# Patient Record
Sex: Female | Born: 1985 | Race: Black or African American | Hispanic: No | Marital: Married | Smoking: Never smoker
Health system: Southern US, Community
[De-identification: ages and names within clinical notes are randomized; demographics above are authoritative.]

## PROBLEM LIST (undated history)

## (undated) DIAGNOSIS — O139 Gestational [pregnancy-induced] hypertension without significant proteinuria, unspecified trimester: Secondary | ICD-10-CM

## (undated) HISTORY — PX: TONSILLECTOMY: SUR1361

---

## 2019-09-19 ENCOUNTER — Other Ambulatory Visit: Payer: Self-pay

## 2019-09-19 ENCOUNTER — Encounter (HOSPITAL_COMMUNITY): Payer: Self-pay

## 2019-09-19 ENCOUNTER — Ambulatory Visit (HOSPITAL_COMMUNITY)
Admission: EM | Admit: 2019-09-19 | Discharge: 2019-09-19 | Disposition: A | Discharge status: Home / Self Care | Payer: Self-pay | Attending: Family Medicine | Admitting: Family Medicine

## 2019-09-19 DIAGNOSIS — N3090 Cystitis, unspecified without hematuria: Secondary | ICD-10-CM | POA: Insufficient documentation

## 2019-09-19 LAB — POCT URINALYSIS DIP (DEVICE)
Bilirubin Urine: NEGATIVE
Glucose, UA: NEGATIVE mg/dL
Ketones, ur: NEGATIVE mg/dL
Nitrite: NEGATIVE
Protein, ur: 100 mg/dL — AB
Specific Gravity, Urine: 1.02 (ref 1.005–1.030)
Urobilinogen, UA: 0.2 mg/dL (ref 0.0–1.0)
pH: 8.5 — ABNORMAL HIGH (ref 5.0–8.0)

## 2019-09-19 MED ORDER — FLUCONAZOLE 150 MG PO TABS: ORAL_TABLET | ORAL | 0 refills | Status: DC

## 2019-09-19 MED ORDER — NITROFURANTOIN MONOHYD MACRO 100 MG PO CAPS: 100.0000 mg | ORAL_CAPSULE | Freq: Two times a day (BID) | ORAL | 0 refills | Status: DC

## 2019-09-19 MED ORDER — PHENAZOPYRIDINE HCL 200 MG PO TABS: 200.0000 mg | ORAL_TABLET | Freq: Three times a day (TID) | ORAL | 0 refills | Status: DC

## 2019-09-19 NOTE — Discharge Instructions (Addendum)
Push fluids Take the antibiotic 2 x a day Take the pyridium for pain ( caution orange urine) Fill and use diflucan if needed for yeast symptoms Return as needed

## 2019-09-19 NOTE — ED Provider Notes (Signed)
[facility]    CSN: [id number] Arrival date & time: 09/19/19  1129      History   Chief Complaint Chief Complaint  Patient presents with  . Appointment  . (11;30 UTI)    HPI Pamela Castro is a 34 y.o. female.   HPI   Patient is here for urinary tract infection.  She states she has had bladder infections before, every year or 2 since she had children.  She has burning, frequency, and traces of blood in her urine.  This is been going on for 2 days.  No flank pain, abdominal pain.  No nausea or vomiting.  No fever or chills.  No history of kidney stones or kidney infections.  Patient is not pregnant She states that her prior urinary tract infection she was given Keflex.  She was then called a couple days later and switched to a sulfa antibiotic.  History reviewed. No pertinent past medical history.  There are no problems to display for this patient.   Past Surgical History:  Procedure Laterality Date  . TONSILLECTOMY      OB History   No obstetric history on file.      Home Medications    Prior to Admission medications   Medication Sig Start Date End Date Taking? Authorizing Provider  fluconazole (DIFLUCAN) 150 MG tablet Take at first sign of yeast infection.  Repeat in 72 hours if needed 09/19/19   Eustace Moore, MD  nitrofurantoin, macrocrystal-monohydrate, (MACROBID) 100 MG capsule Take 1 capsule (100 mg total) by mouth 2 (two) times daily. 09/19/19   Eustace Moore, MD  phenazopyridine (PYRIDIUM) 200 MG tablet Take 1 tablet (200 mg total) by mouth 3 (three) times daily. 09/19/19   Eustace Moore, MD    Family History Family History  Family history unknown: Yes    Social History Social History   Tobacco Use  . Smoking status: Never Smoker  Substance Use Topics  . Alcohol use: Not on file  . Drug use: Not on file     Allergies   Patient has no known allergies.   Review of Systems Review of Systems  Constitutional:  Negative for chills and fever.  Gastrointestinal: Negative for nausea and vomiting.  Genitourinary: Positive for dysuria, frequency and hematuria. Negative for flank pain, menstrual problem, pelvic pain and vaginal discharge.     Physical Exam Triage Vital Signs ED Triage Vitals  Enc Vitals Group     BP 09/19/19 1152 134/68     Pulse Rate 09/19/19 1152 100     Resp 09/19/19 1152 18     Temp 09/19/19 1152 98.4 F (36.9 C)     Temp Source 09/19/19 1152 Oral     SpO2 09/19/19 1152 99 %     Weight --      Height --      Head Circumference --      Peak Flow --      Pain Score 09/19/19 1155 5     Pain Loc --      Pain Edu? --      Excl. in GC? --    No data found.  Updated Vital Signs BP 134/68 (BP Location: Right Arm)   Pulse 100   Temp 98.4 F (36.9 C) (Oral)   Resp 18   LMP 09/07/2019   SpO2 99%     Physical Exam Constitutional:      General: She is not in acute distress.    Appearance:  She is well-developed and normal weight.     Comments: Exam by observation  HENT:     Head: Normocephalic and atraumatic.     Mouth/Throat:     Comments: Mask in place Eyes:     Conjunctiva/sclera: Conjunctivae normal.     Pupils: Pupils are equal, round, and reactive to light.  Cardiovascular:     Rate and Rhythm: Normal rate and regular rhythm.  Pulmonary:     Effort: Pulmonary effort is normal. No respiratory distress.  Musculoskeletal:        General: Normal range of motion.     Cervical back: Normal range of motion.  Skin:    General: Skin is warm and dry.  Neurological:     Mental Status: She is alert.  Psychiatric:        Mood and Affect: Mood normal.        Behavior: Behavior normal.      UC Treatments / Results  Labs (all labs ordered are listed, but only abnormal results are displayed) Labs Reviewed  POCT URINALYSIS DIP (DEVICE) - Abnormal; Notable for the following components:      Result Value   Hgb urine dipstick LARGE (*)    pH 8.5 (*)    Protein,  ur 100 (*)    Leukocytes,Ua MODERATE (*)    All other components within normal limits  URINE CULTURE    EKG   Radiology No results found.  Procedures Procedures (including critical care time)  Medications Ordered in UC Medications - No data to display  Initial Impression / Assessment and Plan / UC Course  I have reviewed the triage vital signs and the nursing notes.  Pertinent labs & imaging results that were available during my care of the patient were reviewed by me and considered in my medical decision making (see chart for details).      Final Clinical Impressions(s) / UC Diagnoses   Final diagnoses:  Cystitis     Discharge Instructions     Push fluids Take the antibiotic 2 x a day Take the pyridium for pain ( caution orange urine) Fill and use diflucan if needed for yeast symptoms Return as needed    ED Prescriptions    Medication Sig Dispense Auth. Provider   nitrofurantoin, macrocrystal-monohydrate, (MACROBID) 100 MG capsule Take 1 capsule (100 mg total) by mouth 2 (two) times daily. 14 capsule Raylene Everts, MD   phenazopyridine (PYRIDIUM) 200 MG tablet Take 1 tablet (200 mg total) by mouth 3 (three) times daily. 6 tablet Raylene Everts, MD   fluconazole (DIFLUCAN) 150 MG tablet Take at first sign of yeast infection.  Repeat in 72 hours if needed 2 tablet Meda Coffee Jennette Banker, MD     PDMP not reviewed this encounter.   Raylene Everts, MD 09/19/19 [phone]

## 2019-09-19 NOTE — ED Triage Notes (Signed)
Pt presents with urinary tract issues; pt states she has burning with urination, urinary, and blood in urine X 2 days.

## 2019-09-22 LAB — URINE CULTURE: Culture: >=100000 — AB

## 2020-05-23 ENCOUNTER — Other Ambulatory Visit: Payer: Self-pay

## 2020-05-23 ENCOUNTER — Encounter (HOSPITAL_COMMUNITY): Payer: Self-pay

## 2020-05-23 ENCOUNTER — Ambulatory Visit (HOSPITAL_COMMUNITY)
Admission: EM | Admit: 2020-05-23 | Discharge: 2020-05-23 | Disposition: A | Discharge status: Home / Self Care | Payer: BC Managed Care – PPO | Attending: Family Medicine | Admitting: Family Medicine

## 2020-05-23 DIAGNOSIS — Z3202 Encounter for pregnancy test, result negative: Secondary | ICD-10-CM | POA: Insufficient documentation

## 2020-05-23 DIAGNOSIS — J069 Acute upper respiratory infection, unspecified: Secondary | ICD-10-CM

## 2020-05-23 DIAGNOSIS — U071 COVID-19: Secondary | ICD-10-CM | POA: Insufficient documentation

## 2020-05-23 HISTORY — DX: Gestational (pregnancy-induced) hypertension without significant proteinuria, unspecified trimester: O13.9

## 2020-05-23 LAB — RESP PANEL BY RT PCR (RSV, FLU A&B, COVID)
Influenza A by PCR: NEGATIVE
Influenza B by PCR: NEGATIVE
Respiratory Syncytial Virus by PCR: NEGATIVE
SARS Coronavirus 2 by RT PCR: POSITIVE — AB

## 2020-05-23 LAB — POC URINE PREG, ED: Preg Test, Ur: NEGATIVE

## 2020-05-23 MED ORDER — PROMETHAZINE-DM 6.25-15 MG/5ML PO SYRP: 5.0000 mL | ORAL_SOLUTION | Freq: Four times a day (QID) | ORAL | 0 refills | Status: DC | PRN

## 2020-05-23 MED ORDER — PREDNISONE 20 MG PO TABS: 40.0000 mg | ORAL_TABLET | Freq: Every day | ORAL | 0 refills | Status: DC

## 2020-05-23 MED ORDER — ALBUTEROL SULFATE HFA 108 (90 BASE) MCG/ACT IN AERS: 1.0000 | INHALATION_SPRAY | Freq: Four times a day (QID) | RESPIRATORY_TRACT | 0 refills | Status: DC | PRN

## 2020-05-23 MED ORDER — FLUTICASONE PROPIONATE 50 MCG/ACT NA SUSP: 1.0000 | Freq: Two times a day (BID) | NASAL | 0 refills | Status: DC

## 2020-05-23 NOTE — ED Provider Notes (Signed)
[facility]    CSN: [id number] Arrival date & time: 05/23/20  1116      History   Chief Complaint Chief Complaint  Patient presents with  . Fever    HPI Pamela Castro is a 34 y.o. female.   Presenting today with 8 day hx of hacking cough, low grade fever initially, body aches, diarrhea, fatigue, loss of taste and smell, and now N/V intermittently. Denies CP, SOB, abdominal pain, rashes. Taking dayquil, nyquil, and OTC supplements without much benefit. Kids also sick with similar sxs. Not UTD on COVID vaccines. No known chronic pulmonary issues.  Also requesting preg test as she missed a few birth control pills and her period this past month (05/06/20 LMP) was not a typical menstrual cycle for her).     Past Medical History:  Diagnosis Date  . Gestational hypertension     There are no problems to display for this patient.   Past Surgical History:  Procedure Laterality Date  . TONSILLECTOMY      OB History   No obstetric history on file.      Home Medications    Prior to Admission medications   Medication Sig Start Date End Date Taking? Authorizing Provider  nitrofurantoin, macrocrystal-monohydrate, (MACROBID) 100 MG capsule Take 1 capsule (100 mg total) by mouth 2 (two) times daily. 09/19/19  Yes Eustace Moore, MD  Norethindrone Acetate-Ethinyl Estradiol (JUNEL 1.5/30) 1.5-30 MG-MCG tablet Junel FE 1.5/30 (28) 1.5 mg-30 mcg (21)/75 mg (7) tablet  Take 1 tablet every day by oral route for 84 days.   Yes [provider]  phenazopyridine (PYRIDIUM) 200 MG tablet Take 1 tablet (200 mg total) by mouth 3 (three) times daily. 09/19/19  Yes Eustace Moore, MD  albuterol (VENTOLIN HFA) 108 (90 Base) MCG/ACT inhaler Inhale 1-2 puffs into the lungs every 6 (six) hours as needed for wheezing or shortness of breath. 05/23/20   Particia Nearing, PA-C  fluconazole (DIFLUCAN) 150 MG tablet Take at first sign of yeast infection.  Repeat in 72  hours if needed 09/19/19   Eustace Moore, MD  fluticasone [facility]) 50 MCG/ACT nasal spray Place 1 spray into both nostrils 2 (two) times daily. 05/23/20   Particia Nearing, PA-C  predniSONE (DELTASONE) 20 MG tablet Take 2 tablets (40 mg total) by mouth daily with breakfast. 05/23/20   Particia Nearing, PA-C  promethazine-dextromethorphan (PROMETHAZINE-DM) 6.25-15 MG/5ML syrup Take 5 mLs by mouth 4 (four) times daily as needed for cough. 05/23/20   Particia Nearing, PA-C    Family History Family History  Family history unknown: Yes    Social History Social History   Tobacco Use  . Smoking status: Never Smoker  . Smokeless tobacco: Never Used  Vaping Use  . Vaping Use: Never used  Substance Use Topics  . Alcohol use: Yes  . Drug use: Never     Allergies   Patient has no known allergies.   Review of Systems Review of Systems PER HPI    Physical Exam Triage Vital Signs ED Triage Vitals  Enc Vitals Group     BP 05/23/20 1235 (!) 155/104     Pulse Rate 05/23/20 1235 100     Resp 05/23/20 1235 18     Temp 05/23/20 1235 98.2 F (36.8 C)     Temp Source 05/23/20 1235 Oral     SpO2 05/23/20 1235 98 %     Weight --      Height --  Head Circumference --      Peak Flow --      Pain Score 05/23/20 1229 0     Pain Loc --      Pain Edu? --      Excl. in GC? --    No data found.  Updated Vital Signs BP (!) 155/104 (BP Location: Right Arm)   Pulse 100   Temp 98.2 F (36.8 C) (Oral)   Resp 18   LMP 05/06/2020 (Approximate)   SpO2 98%   Visual Acuity Right Eye Distance:   Left Eye Distance:   Bilateral Distance:    Right Eye Near:   Left Eye Near:    Bilateral Near:     Physical Exam Vitals and nursing note reviewed.  Constitutional:      Appearance: Normal appearance. She is not ill-appearing.  HENT:     Head: Atraumatic.     Ears:     Comments: Mild middle ear effusion b/l    Nose: Rhinorrhea present.     Mouth/Throat:      Mouth: Mucous membranes are moist.     Pharynx: Posterior oropharyngeal erythema present.  Eyes:     Extraocular Movements: Extraocular movements intact.     Conjunctiva/sclera: Conjunctivae normal.  Cardiovascular:     Rate and Rhythm: Normal rate and regular rhythm.     Heart sounds: Normal heart sounds.  Pulmonary:     Effort: Pulmonary effort is normal.     Breath sounds: Normal breath sounds. No wheezing or rales.  Abdominal:     General: Bowel sounds are normal. There is no distension.     Palpations: Abdomen is soft.     Tenderness: There is no abdominal tenderness. There is no guarding.  Musculoskeletal:        General: Normal range of motion.     Cervical back: Normal range of motion and neck supple.  Skin:    General: Skin is warm and dry.  Neurological:     Mental Status: She is alert and oriented to person, place, and time.  Psychiatric:        Mood and Affect: Mood normal.        Thought Content: Thought content normal.        Judgment: Judgment normal.      UC Treatments / Results  Labs (all labs ordered are listed, but only abnormal results are displayed) Labs Reviewed  RESP PANEL BY RT PCR (RSV, FLU A&B, COVID)  POC URINE PREG, ED    EKG   Radiology No results found.  Procedures Procedures (including critical care time)  Medications Ordered in UC Medications - No data to display  Initial Impression / Assessment and Plan / UC Course  I have reviewed the triage vital signs and the nursing notes.  Pertinent labs & imaging results that were available during my care of the patient were reviewed by me and considered in my medical decision making (see chart for details).     Urine preg neg, resp panel pending. Vital signs today reassuring as was physical exam. Discussed albuterol inhaler, prednisone burst, phenergan DM and flonase to help with her viral bronchitis and supportive medications and home care. Return precautions and isolation protocol  reviewed at length. Work note given.   Final Clinical Impressions(s) / UC Diagnoses   Final diagnoses:  Negative pregnancy test  Viral URI with cough   Discharge Instructions   None    ED Prescriptions    Medication Sig Dispense  Auth. Provider   predniSONE (DELTASONE) 20 MG tablet Take 2 tablets (40 mg total) by mouth daily with breakfast. 10 tablet Particia Nearing, PA-C   promethazine-dextromethorphan (PROMETHAZINE-DM) 6.25-15 MG/5ML syrup Take 5 mLs by mouth 4 (four) times daily as needed for cough. 100 mL Particia Nearing, PA-C   albuterol (VENTOLIN HFA) 108 (90 Base) MCG/ACT inhaler Inhale 1-2 puffs into the lungs every 6 (six) hours as needed for wheezing or shortness of breath. 18 g Particia Nearing, PA-C   fluticasone [facility]) 50 MCG/ACT nasal spray Place 1 spray into both nostrils 2 (two) times daily. 16 g Particia Nearing, [place]     PDMP not reviewed this encounter.   Particia Nearing, [place] 05/23/20 1507

## 2020-05-23 NOTE — ED Triage Notes (Signed)
Pt states she awoke on 10/29 coughing and "choking like I swallowed a spider", low-grade fever the following day with Tmax 100.6. Monday 11/1 awoke with body aches, diarrhea; Tuesday worsening cough with "chest congestion", fatigue. Thursday loss of taste/smell.  Yesterday onset of n/v.  Denies SOB at present, ear pain, sore throat. Able to speak full sentences w/o difficulty.  Last took ibuprofen yesterday. Was taking dayquil, nyquil and OTC herbs.   Has not taken COVID vaccines. Pt also states she has missed taking her BCPs correctly and requests a preg test.

## 2020-05-24 ENCOUNTER — Other Ambulatory Visit: Payer: Self-pay | Admitting: Nurse Practitioner

## 2020-05-24 ENCOUNTER — Encounter: Payer: Self-pay | Admitting: Nurse Practitioner

## 2020-05-24 ENCOUNTER — Ambulatory Visit (HOSPITAL_COMMUNITY)
Admission: RE | Admit: 2020-05-24 | Discharge: 2020-05-24 | Disposition: A | Discharge status: Home / Self Care | Payer: BC Managed Care – PPO | Source: Ambulatory Visit | Attending: Pulmonary Disease | Admitting: Pulmonary Disease

## 2020-05-24 DIAGNOSIS — U071 COVID-19: Secondary | ICD-10-CM | POA: Diagnosis not present

## 2020-05-24 MED ORDER — DIPHENHYDRAMINE HCL 50 MG/ML IJ SOLN: 50.0000 mg | Freq: Once | INTRAMUSCULAR | Status: DC | PRN

## 2020-05-24 MED ORDER — METHYLPREDNISOLONE SODIUM SUCC 125 MG IJ SOLR: 125.0000 mg | Freq: Once | INTRAMUSCULAR | Status: DC | PRN

## 2020-05-24 MED ORDER — ALBUTEROL SULFATE HFA 108 (90 BASE) MCG/ACT IN AERS: 2.0000 | INHALATION_SPRAY | Freq: Once | RESPIRATORY_TRACT | Status: DC | PRN

## 2020-05-24 MED ORDER — FAMOTIDINE IN NACL 20-0.9 MG/50ML-% IV SOLN: 20.0000 mg | Freq: Once | INTRAVENOUS | Status: DC | PRN

## 2020-05-24 MED ORDER — SOTROVIMAB 500 MG/8ML IV SOLN
500.0000 mg | Freq: Once | INTRAVENOUS | Status: AC
  Administered 2020-05-24: 500 mg via INTRAVENOUS

## 2020-05-24 MED ORDER — EPINEPHRINE 0.3 MG/0.3ML IJ SOAJ: 0.3000 mg | Freq: Once | INTRAMUSCULAR | Status: DC | PRN

## 2020-05-24 MED ORDER — SODIUM CHLORIDE 0.9 % IV SOLN: INTRAVENOUS | Status: DC | PRN

## 2020-05-24 NOTE — Progress Notes (Signed)
I connected by phone with Pamela Castro on 05/24/2020 at 9:16 AM to discuss the potential use of a treatment for mild to moderate COVID-19 viral infection in non-hospitalized patients.  This patient is a 34 y.o. female that meets the FDA criteria for Emergency Use Authorization of bamlanivimab/etesevimab, casirivimab\imdevimab, or sotrovimab  Has a (+) direct SARS-CoV-2 viral test result  Has mild or moderate COVID-19   Is ? 34 years of age and weighs ? 40 kg  Is NOT hospitalized due to COVID-19  Is NOT requiring oxygen therapy or requiring an increase in baseline oxygen flow rate due to COVID-19  Is within 10 days of symptom onset  Has at least one of the high risk factor(s) for progression to severe COVID-19 and/or hospitalization as defined in EUA.  Specific high risk criteria : Other high risk medical condition per CDC:  SVI  I have spoken and communicated the following to the patient or parent/caregiver:  1. FDA has authorized the emergency use of bamlanivimab/etesevimab, casirivimab\imdevimab, or sotrovimab for the treatment of mild to moderate COVID-19 in adults and pediatric patients with positive results of direct SARS-CoV-2 viral testing who are 92 years of age and older weighing at least 40 kg, and who are at high risk for progressing to severe COVID-19 and/or hospitalization.  2. The significant known and potential risks and benefits of bamlanivimab/etesevimab, casirivimab\imdevimab, or sotrovimab, and the extent to which such potential risks and benefits are unknown.  3. Information on available alternative treatments and the risks and benefits of those alternatives, including clinical trials.  4. Patients treated with bamlanivimab/etesevimab, casirivimab\imdevimab, or sotrovimab should continue to self-isolate and use infection control measures (e.g., wear mask, isolate, social distance, avoid sharing personal items, clean and disinfect "high touch" surfaces, and  frequent handwashing) according to CDC guidelines.   5. The patient or parent/caregiver has the option to accept or refuse bamlanivimab/etesevimab, casirivimab\imdevimab, or sotrovimab.  After reviewing this information with the patient, the patient has agreed to receive one of the available covid 19 monoclonal antibodies and will be provided an appropriate fact sheet prior to infusion.  Consuello Masse, DNP, AGNP-C [phone] (Infusion Center Hotline)

## 2020-05-24 NOTE — Discharge Instructions (Signed)

## 2020-05-24 NOTE — Progress Notes (Signed)
Diagnosis: COVID-19  Physician: Dr. Wright  Procedure: Covid Infusion Clinic Med: Sotrovimab-Provided patient with Sotrovimab fact sheet for patients, parents and caregivers prior to infusion.  Complications: No immediate complications noted.  Discharge: Discharged home   Pamela Castro 05/24/2020   

## 2020-06-01 ENCOUNTER — Other Ambulatory Visit (INDEPENDENT_AMBULATORY_CARE_PROVIDER_SITE_OTHER): Payer: Self-pay

## 2020-06-02 ENCOUNTER — Encounter (HOSPITAL_COMMUNITY): Payer: Self-pay | Admitting: Emergency Medicine

## 2020-06-02 ENCOUNTER — Ambulatory Visit (INDEPENDENT_AMBULATORY_CARE_PROVIDER_SITE_OTHER): Payer: BC Managed Care – PPO

## 2020-06-02 ENCOUNTER — Ambulatory Visit (HOSPITAL_COMMUNITY)
Admission: EM | Admit: 2020-06-02 | Discharge: 2020-06-02 | Disposition: A | Discharge status: Home / Self Care | Payer: BC Managed Care – PPO | Attending: Emergency Medicine | Admitting: Emergency Medicine

## 2020-06-02 ENCOUNTER — Other Ambulatory Visit: Payer: Self-pay

## 2020-06-02 DIAGNOSIS — U071 COVID-19: Secondary | ICD-10-CM | POA: Diagnosis not present

## 2020-06-02 DIAGNOSIS — R059 Cough, unspecified: Secondary | ICD-10-CM

## 2020-06-02 DIAGNOSIS — R058 Other specified cough: Secondary | ICD-10-CM | POA: Diagnosis not present

## 2020-06-02 MED ORDER — AEROCHAMBER PLUS MISC: 2 refills | Status: DC

## 2020-06-02 MED ORDER — HYDROCOD POLST-CPM POLST ER 10-8 MG/5ML PO SUER: 5.0000 mL | Freq: Two times a day (BID) | ORAL | 0 refills | Status: DC | PRN

## 2020-06-02 MED ORDER — ALBUTEROL SULFATE HFA 108 (90 BASE) MCG/ACT IN AERS: 1.0000 | INHALATION_SPRAY | RESPIRATORY_TRACT | 0 refills | Status: DC | PRN

## 2020-06-02 MED ORDER — BENZONATATE 200 MG PO CAPS: 200.0000 mg | ORAL_CAPSULE | Freq: Three times a day (TID) | ORAL | 0 refills | Status: DC | PRN

## 2020-06-02 NOTE — ED Provider Notes (Signed)
HPI  SUBJECTIVE:  Pamela Castro is a 34 y.o. female who presents with 5 days of nasal congestion, yellowish rhinorrhea, cough productive of the same material as her nasal congestion.  She reports a continuous tickle in the back of her throat.  She reports sinus pain and pressure which has now resolved.  She reports wheezing and intermittent achy sharp abdominal soreness secondary to the cough.  She was diagnosed with Covid on 11/7 and is status post monoclonal antibody infusion.  She states that she was doing well last week when the cough reappeared.  She is concerned she may have Covid again.  She denies sore throat, chest pain.  No calf pain or swelling, hemoptysis, recent immobilization, pleuritic chest pain.  She has tried Vicks, promethazine cough syrup, albuterol and prednisone.  She states the promethazine helps.  Symptoms are worse with talking and seem to be aggravated with albuterol.  She does not have a spacer for her inhaler.  She denies GERD symptoms.  She has a past medical history of gestational hypertension and has had labile blood pressures for some time.  She is not on any medication for this.  No history of diabetes, PE, DVT.  She has a past medical history of GERD and pregnancy.  No asthma, emphysema, COPD, smoking.  She is on OCPs.Marland Kitchen  LMP: 10/21.  Denies possibility being pregnant.  PMD: None.    Past Medical History:  Diagnosis Date  . Gestational hypertension     Past Surgical History:  Procedure Laterality Date  . TONSILLECTOMY      Family History  Family history unknown: Yes    Social History   Tobacco Use  . Smoking status: Never Smoker  . Smokeless tobacco: Never Used  Vaping Use  . Vaping Use: Never used  Substance Use Topics  . Alcohol use: Yes  . Drug use: Never    No current facility-administered medications for this encounter.  Current Outpatient Medications:  .  albuterol (VENTOLIN HFA) 108 (90 Base) MCG/ACT inhaler, Inhale 1-2 puffs into  the lungs every 6 (six) hours as needed for wheezing or shortness of breath., Disp: 18 g, Rfl: 0 .  albuterol (VENTOLIN HFA) 108 (90 Base) MCG/ACT inhaler, Inhale 1-2 puffs into the lungs every 4 (four) hours as needed for wheezing or shortness of breath., Disp: 1 each, Rfl: 0 .  benzonatate (TESSALON) 200 MG capsule, Take 1 capsule (200 mg total) by mouth 3 (three) times daily as needed for cough., Disp: 30 capsule, Rfl: 0 .  chlorpheniramine-HYDROcodone (TUSSIONEX PENNKINETIC ER) 10-8 MG/5ML SUER, Take 5 mLs by mouth every 12 (twelve) hours as needed for cough., Disp: 60 mL, Rfl: 0 .  fluticasone (FLONASE) 50 MCG/ACT nasal spray, Place 1 spray into both nostrils 2 (two) times daily., Disp: 16 g, Rfl: 0 .  Norethindrone Acetate-Ethinyl Estradiol (JUNEL 1.5/30) 1.5-30 MG-MCG tablet, Junel FE 1.5/30 (28) 1.5 mg-30 mcg (21)/75 mg (7) tablet  Take 1 tablet every day by oral route for 84 days., Disp: , Rfl:  .  Spacer/Aero-Holding Chambers (AEROCHAMBER PLUS) inhaler, Use with inhaler, Disp: 1 each, Rfl: 2  No Known Allergies   ROS  As noted in HPI.   Physical Exam  BP (!) 148/120 (BP Location: Right Arm)   Pulse 99   Temp 98.3 F (36.8 C) (Oral)   Resp 19   LMP 05/06/2020 (Approximate)   SpO2 99%   Constitutional: Well developed, well nourished, no acute distress Eyes:  EOMI, conjunctiva normal bilaterally HENT: Normocephalic,  atraumatic,mucus membranes moist.  Positive nasal congestion.  Erythematous, swollen turbinates.  No maxillary, frontal sinus tenderness.  No obvious postnasal drip.  Positive mild cobblestoning. Respiratory: Normal inspiratory effort.  Coughing with deep inspiration.  Lungs clear bilaterally.  No anterior, lateral chest wall tenderness Cardiovascular: Normal rate regular rhythm no murmurs rubs or gallops GI: nondistended.  Mild tenderness over the rectus abdominis.  Negative McBurney, negative Murphy, no other abdominal tenderness. skin: No rash, skin  intact Musculoskeletal: Calves symmetric, nontender, no edema Neurologic: Alert & oriented x 3, no focal neuro deficits Psychiatric: Speech and behavior appropriate   ED Course   Medications - No data to display  Orders Placed This Encounter  Procedures  . DG Chest 2 View    Standing Status:   Standing    Number of Occurrences:   1    Order Specific Question:   Reason for Exam (SYMPTOM  OR DIAGNOSIS REQUIRED)    Answer:   COVID + cough r/o PNA    No results found for this or any previous visit (from the past 24 hour(s)). DG Chest 2 View  Result Date: 06/02/2020 CLINICAL DATA:  Cough.  COVID-19 virus infection. EXAM: CHEST - 2 VIEW COMPARISON:  None. FINDINGS: The heart size and mediastinal contours are within normal limits. Both lungs are clear. The visualized skeletal structures are unremarkable. IMPRESSION: Normal study. Electronically Signed   By: Danae Orleans M.D.   On: 06/02/2020 20:00    ED Clinical Impression  1. Post-viral cough syndrome      ED Assessment/Plan  [place] Narcotic database reviewed for this patient, and feel that the risk/benefit ratio today is favorable for proceeding with a prescription for controlled substance.  No opiate prescriptions in 2 years.  Reviewed imaging independently.  No pneumonia.  See radiology report for full details.  Suspect post viral cough syndrome.  She has no evidence of sinusitis.  Chest x-ray is negative for bronchitis or pneumonia.  Doubt PE in the absence of tachycardia, hypoxia, pleuritic chest pain.  Continue Flonase, start saline nasal irrigation.  Tussionex for the cough during the night, Tessalon for the cough during the day.  She is to try using the albuterol with a spacer.  Will provide primary care list for ongoing care and further management of her high blood pressure.  Emphasized importance of seeing a PMD to get this under control.    Discussed  imaging, MDM, treatment plan, and plan for follow-up with patient.  Discussed sn/sx that should prompt return to the ED. patient agrees with plan.   Meds ordered this encounter  Medications  . benzonatate (TESSALON) 200 MG capsule    Sig: Take 1 capsule (200 mg total) by mouth 3 (three) times daily as needed for cough.    Dispense:  30 capsule    Refill:  0  . chlorpheniramine-HYDROcodone (TUSSIONEX PENNKINETIC ER) 10-8 MG/5ML SUER    Sig: Take 5 mLs by mouth every 12 (twelve) hours as needed for cough.    Dispense:  60 mL    Refill:  0  . Spacer/Aero-Holding Chambers (AEROCHAMBER PLUS) inhaler    Sig: Use with inhaler    Dispense:  1 each    Refill:  2    Please educate patient on use  . albuterol (VENTOLIN HFA) 108 (90 Base) MCG/ACT inhaler    Sig: Inhale 1-2 puffs into the lungs every 4 (four) hours as needed for wheezing or shortness of breath.    Dispense:  1 each  Refill:  0    *This clinic note was created using Scientist, clinical (histocompatibility and immunogenetics). Therefore, there may be occasional mistakes despite careful proofreading.   ?    Domenick Gong, MD 06/03/20 [phone]

## 2020-06-02 NOTE — Telephone Encounter (Signed)
Patient called and she says that she was diagnosed with COVID at the UC on 05/23/20 and was given this cough syrup. She says the symptoms was better, but now they are getting worse with coughing, hard to breathe. She says she would like a refill so she can get through her work day without coughing. She has no PCP, advised to go to the UC or ED. She verbalized understanding.

## 2020-06-02 NOTE — ED Triage Notes (Signed)
Pt presents with productive cough and abdominal pain xs 4 days.

## 2020-06-02 NOTE — Discharge Instructions (Signed)
Suspect this is a post viral cough syndrome, where your lungs are in the process of healing.  This could take several weeks unfortunately.  Your x-ray was negative for bronchitis or pneumonia.  You do not appear to have a sinus infection.  Tessalon for the cough during the day, Tussionex for the cough at night.  Try the albuterol inhaler with a spacer.  If the albuterol does not help, then do not fill the prescription that I am giving you today.  Continue Flonase.  Start saline nasal irrigation with a Lloyd Huger med rinse and distilled water as often as you want to help stop postnasal drip and prevent a bacterial sinus infection.  Below is a list of primary care practices who are taking new patients for you to follow-up with.  [facility] internal medicine clinic Ground Floor - [facility], [address] [place], [place], [place] [address] [phone]  [facility] Care at [facility] [address] [place], [place] [address] [phone]  [facility] [address] [place], [place] [address] [phone]  [facility] [phone] [address] [place] [address]  [facility]: [address] [place] [address]  [phone]  [facility]: [address] [place] [address]   [phone]  [facility]: [address] [place] [address]  [phone]  [facility] : [address] [place] [address] [phone]  [facility] Primary [facility]: [address] [place] [address] [phone]  [facility]: [address] [place] [address] [phone]  Dr. Oneal Grout [address] [facility] [place] [address]  [phone]  Dr. Jackie Plum,  [facility]. [address] [place], [place] [address]  [phone]  Go to www.goodrx.com to look up your medications. This will give you a list of where you can find your prescriptions at the most affordable prices. Or ask the pharmacist what the cash price is, or if they have any other discount programs available to help make your medication more affordable. This can be less expensive than what you would pay with insurance.

## 2020-06-03 ENCOUNTER — Ambulatory Visit (HOSPITAL_COMMUNITY): Payer: Self-pay

## 2020-07-14 ENCOUNTER — Other Ambulatory Visit: Payer: Self-pay | Admitting: Family Medicine

## 2020-10-28 ENCOUNTER — Encounter (HOSPITAL_COMMUNITY): Payer: Self-pay

## 2020-10-28 ENCOUNTER — Ambulatory Visit (HOSPITAL_COMMUNITY)
Admission: EM | Admit: 2020-10-28 | Discharge: 2020-10-28 | Disposition: A | Discharge status: Home / Self Care | Payer: Medicaid Other | Attending: Family Medicine | Admitting: Family Medicine

## 2020-10-28 ENCOUNTER — Other Ambulatory Visit: Payer: Self-pay

## 2020-10-28 DIAGNOSIS — J069 Acute upper respiratory infection, unspecified: Secondary | ICD-10-CM | POA: Diagnosis not present

## 2020-10-28 DIAGNOSIS — R059 Cough, unspecified: Secondary | ICD-10-CM | POA: Insufficient documentation

## 2020-10-28 DIAGNOSIS — R509 Fever, unspecified: Secondary | ICD-10-CM | POA: Diagnosis not present

## 2020-10-28 DIAGNOSIS — Z20822 Contact with and (suspected) exposure to covid-19: Secondary | ICD-10-CM | POA: Diagnosis not present

## 2020-10-28 LAB — SARS CORONAVIRUS 2 (TAT 6-24 HRS): SARS Coronavirus 2: NEGATIVE

## 2020-10-28 MED ORDER — PROMETHAZINE-DM 6.25-15 MG/5ML PO SYRP: 5.0000 mL | ORAL_SOLUTION | Freq: Four times a day (QID) | ORAL | 0 refills | Status: DC | PRN

## 2020-10-28 MED ORDER — CETIRIZINE HCL 10 MG PO TABS: 10.0000 mg | ORAL_TABLET | Freq: Every day | ORAL | 2 refills | Status: DC

## 2020-10-28 NOTE — ED Triage Notes (Signed)
Pt c/o cough, runny nose, nasal congestion, fever and fatigue. She states she has had itchy ear and sore throat.

## 2020-10-28 NOTE — ED Provider Notes (Signed)
[facility]    CSN: [id number] Arrival date & time: 10/28/20  7829      History   Chief Complaint Chief Complaint  Patient presents with  . Fever  . Cough  . Nasal Congestion  . Fatigue    HPI Pamela Castro is a 35 y.o. female.   Patient presented today with her children for evaluation of about 6-day history of cough, runny nose, congestion, sore throat, fatigue.  Denies fever, chills, chest pain, shortness of breath, dizziness, abdominal pain, nausea vomiting diarrhea.  So far trying Delsym and Mucinex with no relief.  Does have history of seasonal allergies.  Children sick with similar symptoms.      Past Medical History:  Diagnosis Date  . Gestational hypertension     There are no problems to display for this patient.   Past Surgical History:  Procedure Laterality Date  . TONSILLECTOMY      OB History   No obstetric history on file.      Home Medications    Prior to Admission medications   Medication Sig Start Date End Date Taking? Authorizing Provider  cetirizine (ZYRTEC ALLERGY) 10 MG tablet Take 1 tablet (10 mg total) by mouth daily. 10/28/20  Yes Particia Nearing, PA-C  promethazine-dextromethorphan (PROMETHAZINE-DM) 6.25-15 MG/5ML syrup Take 5 mLs by mouth 4 (four) times daily as needed for cough. 10/28/20  Yes Particia Nearing, PA-C  albuterol (VENTOLIN HFA) 108 (90 Base) MCG/ACT inhaler Inhale 1-2 puffs into the lungs every 6 (six) hours as needed for wheezing or shortness of breath. 05/23/20   Particia Nearing, PA-C  albuterol (VENTOLIN HFA) 108 (90 Base) MCG/ACT inhaler Inhale 1-2 puffs into the lungs every 4 (four) hours as needed for wheezing or shortness of breath. 06/02/20   Domenick Gong, MD  benzonatate (TESSALON) 200 MG capsule Take 1 capsule (200 mg total) by mouth 3 (three) times daily as needed for cough. 06/02/20   Domenick Gong, MD  chlorpheniramine-HYDROcodone (TUSSIONEX PENNKINETIC ER) 10-8  MG/5ML SUER Take 5 mLs by mouth every 12 (twelve) hours as needed for cough. 06/02/20   Domenick Gong, MD  fluticasone (FLONASE) 50 MCG/ACT nasal spray Place 1 spray into both nostrils 2 (two) times daily. 05/23/20   Particia Nearing, PA-C  Norethindrone Acetate-Ethinyl Estradiol (JUNEL 1.5/30) 1.5-30 MG-MCG tablet Junel FE 1.5/30 (28) 1.5 mg-30 mcg (21)/75 mg (7) tablet  Take 1 tablet every day by oral route for 84 days.    [provider]  Spacer/Aero-Holding Chambers (AEROCHAMBER PLUS) inhaler Use with inhaler 06/02/20   Domenick Gong, MD    Family History Family History  Family history unknown: Yes    Social History Social History   Tobacco Use  . Smoking status: Never Smoker  . Smokeless tobacco: Never Used  Vaping Use  . Vaping Use: Never used  Substance Use Topics  . Alcohol use: Yes  . Drug use: Never     Allergies   Patient has no known allergies.   Review of Systems Review of Systems Per HPI  Physical Exam Triage Vital Signs ED Triage Vitals  Enc Vitals Group     BP 10/28/20 0853 (!) 154/104     Pulse Rate 10/28/20 0853 72     Resp 10/28/20 0853 19     Temp 10/28/20 0853 98 F (36.7 C)     Temp Source 10/28/20 0853 Oral     SpO2 10/28/20 0853 98 %     Weight --  Height --      Head Circumference --      Peak Flow --      Pain Score 10/28/20 0852 0     Pain Loc --      Pain Edu? --      Excl. in GC? --    No data found.  Updated Vital Signs BP (!) 154/104 (BP Location: Right Arm)   Pulse 72   Temp 98 F (36.7 C) (Oral)   Resp 19   LMP 10/22/2020 (Exact Date)   SpO2 98%   Visual Acuity Right Eye Distance:   Left Eye Distance:   Bilateral Distance:    Right Eye Near:   Left Eye Near:    Bilateral Near:     Physical Exam Vitals and nursing note reviewed.  Constitutional:      Appearance: Normal appearance. She is not ill-appearing.  HENT:     Head: Atraumatic.     Right Ear: Tympanic membrane normal.      Left Ear: Tympanic membrane normal.     Nose: Rhinorrhea present.     Mouth/Throat:     Mouth: Mucous membranes are moist.     Pharynx: Oropharynx is clear. Posterior oropharyngeal erythema present.  Eyes:     Extraocular Movements: Extraocular movements intact.     Conjunctiva/sclera: Conjunctivae normal.  Cardiovascular:     Rate and Rhythm: Normal rate and regular rhythm.     Heart sounds: Normal heart sounds.  Pulmonary:     Effort: Pulmonary effort is normal. No respiratory distress.     Breath sounds: Normal breath sounds. No wheezing or rales.  Abdominal:     General: Bowel sounds are normal. There is no distension.     Palpations: Abdomen is soft.     Tenderness: There is no abdominal tenderness. There is no guarding.  Musculoskeletal:        General: Normal range of motion.     Cervical back: Normal range of motion and neck supple.  Skin:    General: Skin is warm and dry.     Findings: No erythema or rash.  Neurological:     Mental Status: She is alert and oriented to person, place, and time.     Motor: No weakness.     Gait: Gait normal.  Psychiatric:        Mood and Affect: Mood normal.        Thought Content: Thought content normal.        Judgment: Judgment normal.     UC Treatments / Results  Labs (all labs ordered are listed, but only abnormal results are displayed) Labs Reviewed  SARS CORONAVIRUS 2 (TAT 6-24 HRS)    EKG   Radiology No results found.  Procedures Procedures (including critical care time)  Medications Ordered in UC Medications - No data to display  Initial Impression / Assessment and Plan / UC Course  I have reviewed the triage vital signs and the nursing notes.  Pertinent labs & imaging results that were available during my care of the patient were reviewed by me and considered in my medical decision making (see chart for details).     Vital signs other than mildly elevated blood pressure reassuring today and exam also  reassuring.  Viral versus allergic, will start Phenergan DM, Zyrtec, Mucinex, sinus rinses.  Covid PCR pending, isolation reviewed.  Return precautions given.  Final Clinical Impressions(s) / UC Diagnoses   Final diagnoses:  Viral URI with cough  Discharge Instructions   None    ED Prescriptions    Medication Sig Dispense Auth. Provider   promethazine-dextromethorphan (PROMETHAZINE-DM) 6.25-15 MG/5ML syrup Take 5 mLs by mouth 4 (four) times daily as needed for cough. 100 mL Particia Nearing, PA-C   cetirizine (ZYRTEC ALLERGY) 10 MG tablet Take 1 tablet (10 mg total) by mouth daily. 30 tablet Particia Nearing, [place]     PDMP not reviewed this encounter.   Roosvelt Maser [place], [place] 10/28/20 [id number]

## 2021-03-30 IMAGING — DX DG CHEST 2V
2 series · 2 of 2 positions shown · non-contrast
Comparison: None.

CLINICAL DATA: Cough.  DC864-[id number] virus infection.

EXAM:
CHEST - 2 VIEW

[chest pa]
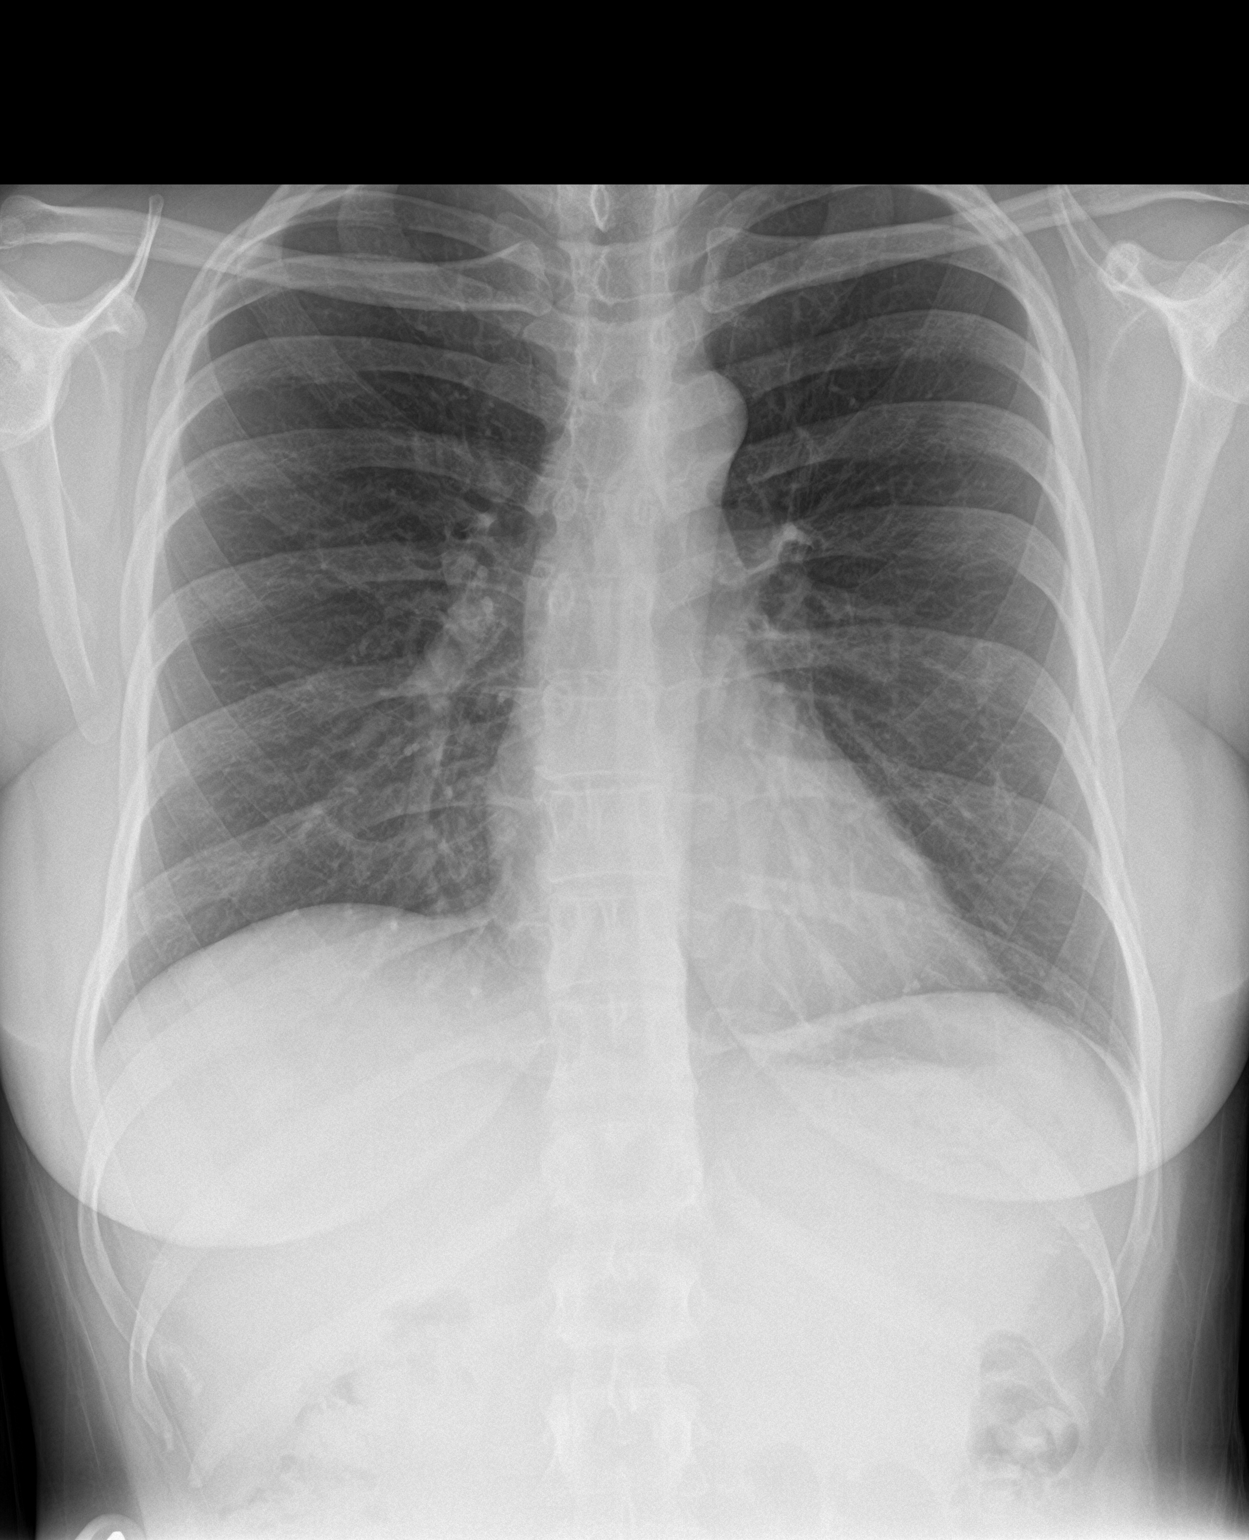

[chest lat]
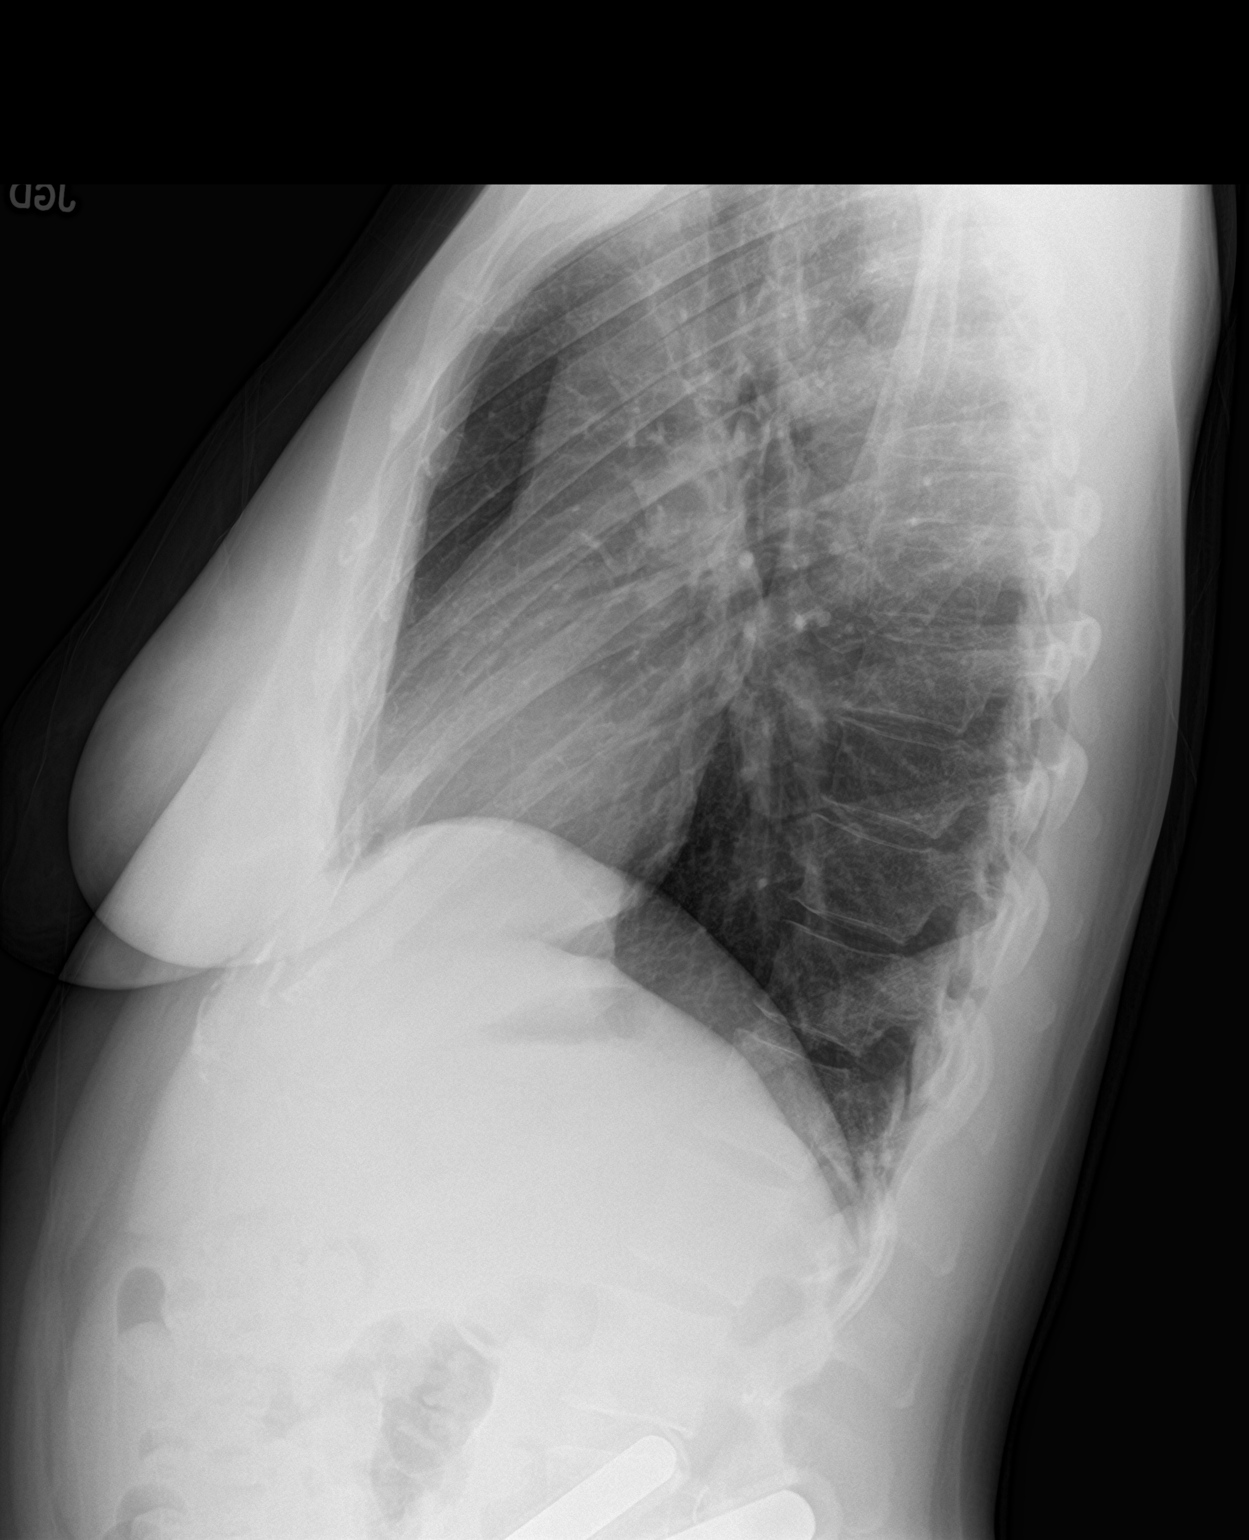

[2 of 2 positions shown; findings below may reference images not displayed]

FINDINGS: The heart size and mediastinal contours are within normal limits.
Both lungs are clear. The visualized skeletal structures are
unremarkable.
IMPRESSION: Normal study.

## 2021-04-05 ENCOUNTER — Ambulatory Visit (HOSPITAL_COMMUNITY)
Admission: EM | Admit: 2021-04-05 | Discharge: 2021-04-05 | Disposition: A | Discharge status: Home / Self Care | Payer: Medicaid Other | Attending: Student | Admitting: Student

## 2021-04-05 ENCOUNTER — Encounter (HOSPITAL_COMMUNITY): Payer: Self-pay

## 2021-04-05 ENCOUNTER — Other Ambulatory Visit: Payer: Self-pay

## 2021-04-05 DIAGNOSIS — J069 Acute upper respiratory infection, unspecified: Secondary | ICD-10-CM | POA: Insufficient documentation

## 2021-04-05 DIAGNOSIS — N3001 Acute cystitis with hematuria: Secondary | ICD-10-CM | POA: Diagnosis not present

## 2021-04-05 LAB — POCT URINALYSIS DIPSTICK, ED / UC
Bilirubin Urine: NEGATIVE
Glucose, UA: NEGATIVE mg/dL
Nitrite: POSITIVE — AB
Protein, ur: 100 mg/dL — AB
Specific Gravity, Urine: >=1.03 (ref 1.005–1.030)
Urobilinogen, UA: 0.2 mg/dL (ref 0.0–1.0)
pH: 6 (ref 5.0–8.0)

## 2021-04-05 LAB — POC URINE PREG, ED: Preg Test, Ur: NEGATIVE

## 2021-04-05 MED ORDER — FLUCONAZOLE 150 MG PO TABS: 150.0000 mg | ORAL_TABLET | Freq: Every day | ORAL | 0 refills | Status: DC

## 2021-04-05 MED ORDER — CEPHALEXIN 500 MG PO CAPS: 500.0000 mg | ORAL_CAPSULE | Freq: Four times a day (QID) | ORAL | 0 refills | Status: DC

## 2021-04-05 MED ORDER — PHENAZOPYRIDINE HCL 100 MG PO TABS: 100.0000 mg | ORAL_TABLET | Freq: Three times a day (TID) | ORAL | 0 refills | Status: DC | PRN

## 2021-04-05 NOTE — ED Provider Notes (Signed)
[facility]    CSN: [id number] Arrival date & time: 04/05/21  1722      History   Chief Complaint Chief Complaint  Patient presents with   UTI   Sore Throat   Chills    HPI Pamela Castro is a 35 y.o. female presenting with multiple complaints.  Medical history frequent UTIs. -Endorses urinary frequency, urgency, dysuria, suprapubic pressure for about 1 week.  Symptoms consistent with past UTI symptoms. Denies vaginal symptoms.  -Also with cough, congestion, sore throat, body aches for about 3 days.  States that she got this from her son, who is flu and COVID-negative.  She is content with over-the-counter medications for management of her symptoms.  HPI  Past Medical History:  Diagnosis Date   Gestational hypertension     There are no problems to display for this patient.   Past Surgical History:  Procedure Laterality Date   TONSILLECTOMY      OB History   No obstetric history on file.      Home Medications    Prior to Admission medications   Medication Sig Start Date End Date Taking? Authorizing Provider  cephALEXin (KEFLEX) 500 MG capsule Take 1 capsule (500 mg total) by mouth 4 (four) times daily. 04/05/21  Yes Rhys Martini, PA-C  fluconazole (DIFLUCAN) 150 MG tablet Take 1 tablet (150 mg total) by mouth daily. -For your yeast infection, start the Diflucan (fluconazole)- Take one pill today (day 1). If you're still having symptoms in 3 days, take the second pill. 04/05/21  Yes Rhys Martini, PA-C  phenazopyridine (PYRIDIUM) 100 MG tablet Take 1 tablet (100 mg total) by mouth 3 (three) times daily as needed for pain. 04/05/21  Yes Rhys Martini, PA-C  albuterol (VENTOLIN HFA) 108 (90 Base) MCG/ACT inhaler Inhale 1-2 puffs into the lungs every 6 (six) hours as needed for wheezing or shortness of breath. 05/23/20   Particia Nearing, PA-C  albuterol (VENTOLIN HFA) 108 (90 Base) MCG/ACT inhaler Inhale 1-2 puffs into the lungs every 4  (four) hours as needed for wheezing or shortness of breath. 06/02/20   Domenick Gong, MD  benzonatate (TESSALON) 200 MG capsule Take 1 capsule (200 mg total) by mouth 3 (three) times daily as needed for cough. 06/02/20   Domenick Gong, MD  cetirizine (ZYRTEC ALLERGY) 10 MG tablet Take 1 tablet (10 mg total) by mouth daily. 10/28/20   Particia Nearing, PA-C  chlorpheniramine-HYDROcodone [facility]) 10-8 MG/5ML SUER Take 5 mLs by mouth every 12 (twelve) hours as needed for cough. 06/02/20   Domenick Gong, MD  fluticasone (FLONASE) 50 MCG/ACT nasal spray Place 1 spray into both nostrils 2 (two) times daily. 05/23/20   Particia Nearing, PA-C  Norethindrone Acetate-Ethinyl Estradiol (JUNEL 1.5/30) 1.5-30 MG-MCG tablet Junel FE 1.5/30 (28) 1.5 mg-30 mcg (21)/75 mg (7) tablet  Take 1 tablet every day by oral route for 84 days.    [provider]  promethazine-dextromethorphan (PROMETHAZINE-DM) 6.25-15 MG/5ML syrup Take 5 mLs by mouth 4 (four) times daily as needed for cough. 10/28/20   Particia Nearing, PA-C  Spacer/Aero-Holding Chambers (AEROCHAMBER PLUS) inhaler Use with inhaler 06/02/20   Domenick Gong, MD    Family History Family History  Problem Relation Age of Onset   Healthy Mother    Cancer Father    Hyperlipidemia Father    Hypertension Father    Diabetes Father     Social History Social History   Tobacco Use  Smoking status: Never   Smokeless tobacco: Never  Vaping Use   Vaping Use: Never used  Substance Use Topics   Alcohol use: Yes   Drug use: Never     Allergies   Patient has no known allergies.   Review of Systems Review of Systems  Constitutional:  Negative for appetite change, chills, diaphoresis and fever.  HENT:  Positive for congestion. Negative for ear pain, rhinorrhea, sinus pressure, sinus pain and sore throat.   Eyes:  Negative for redness and visual disturbance.  Respiratory:  Positive for cough.  Negative for chest tightness, shortness of breath and wheezing.   Cardiovascular:  Negative for chest pain and palpitations.  Gastrointestinal:  Positive for abdominal pain. Negative for blood in stool, constipation, diarrhea, nausea and vomiting.  Genitourinary:  Positive for dysuria and frequency. Negative for decreased urine volume, difficulty urinating, flank pain, genital sores, hematuria and urgency.  Musculoskeletal:  Positive for myalgias. Negative for back pain.  Neurological:  Negative for dizziness, weakness, light-headedness and headaches.  Psychiatric/Behavioral:  Negative for confusion.   All other systems reviewed and are negative.   Physical Exam Triage Vital Signs ED Triage Vitals [04/05/21 1856]  Enc Vitals Group     BP      Pulse      Resp      Temp      Temp src      SpO2      Weight      Height      Head Circumference      Peak Flow      Pain Score 0     Pain Loc      Pain Edu?      Excl. in GC?    No data found.  Updated Vital Signs BP (!) 159/94 (BP Location: Right Arm)   Pulse 67   Temp 98.1 F (36.7 C) (Oral)   LMP 04/01/2021   SpO2 100%   Visual Acuity Right Eye Distance:   Left Eye Distance:   Bilateral Distance:    Right Eye Near:   Left Eye Near:    Bilateral Near:     Physical Exam Vitals reviewed.  Constitutional:      General: She is not in acute distress.    Appearance: Normal appearance. She is not ill-appearing.  HENT:     Head: Normocephalic and atraumatic.     Right Ear: Tympanic membrane, ear canal and external ear normal. No tenderness. No middle ear effusion. There is no impacted cerumen. Tympanic membrane is not perforated, erythematous, retracted or bulging.     Left Ear: Tympanic membrane, ear canal and external ear normal. No tenderness.  No middle ear effusion. There is no impacted cerumen. Tympanic membrane is not perforated, erythematous, retracted or bulging.     Nose: Nose normal. No congestion.      Mouth/Throat:     Mouth: Mucous membranes are moist.     Pharynx: Uvula midline. No oropharyngeal exudate or posterior oropharyngeal erythema.     Comments: Moist mucous membranes Eyes:     Extraocular Movements: Extraocular movements intact.     Pupils: Pupils are equal, round, and reactive to light.  Cardiovascular:     Rate and Rhythm: Normal rate and regular rhythm.     Heart sounds: Normal heart sounds.  Pulmonary:     Effort: Pulmonary effort is normal.     Breath sounds: Normal breath sounds. No decreased breath sounds, wheezing, rhonchi or rales.  Abdominal:  General: Bowel sounds are normal. There is no distension.     Palpations: Abdomen is soft. There is no mass.     Tenderness: There is abdominal tenderness in the suprapubic area. There is no right CVA tenderness, left CVA tenderness, guarding or rebound. Negative signs include Murphy's sign, Rovsing's sign and McBurney's sign.  Skin:    General: Skin is warm.     Capillary Refill: Capillary refill takes less than 2 seconds.     Comments: Good skin turgor  Neurological:     General: No focal deficit present.     Mental Status: She is alert and oriented to person, place, and time.  Psychiatric:        Mood and Affect: Mood normal.        Behavior: Behavior normal.        Thought Content: Thought content normal.        Judgment: Judgment normal.     UC Treatments / Results  Labs (all labs ordered are listed, but only abnormal results are displayed) Labs Reviewed  POCT URINALYSIS DIPSTICK, ED / UC - Abnormal; Notable for the following components:      Result Value   Ketones, ur TRACE (*)    Hgb urine dipstick LARGE (*)    Protein, ur 100 (*)    Nitrite POSITIVE (*)    Leukocytes,Ua SMALL (*)    All other components within normal limits  URINE CULTURE  POC URINE PREG, ED    EKG   Radiology No results found.  Procedures Procedures (including critical care time)  Medications Ordered in UC Medications  - No data to display  Initial Impression / Assessment and Plan / UC Course  I have reviewed the triage vital signs and the nursing notes.  Pertinent labs & imaging results that were available during my care of the patient were reviewed by me and considered in my medical decision making (see chart for details).     This patient is a very pleasant 35 y.o. year old female presenting with acute cystitis and viral URI. Afebrile, nontachycardic, no reproducible CVAT. Today this pt is afebrile nontachycardic nontachypneic, oxygenating well on room air, no wheezes rhonchi or rales.   Declines COVID PCR. UA with blood, nitrite, leuk. Patient has taken Azo. Culture sent. Negative urine pregnancy.  Keflex. Diflucan and Pyridium sent at patient request.  Coding Level 4 for two acute acute illnesses with systemic symptoms, and prescription drug management  ED return precautions discussed. Patient verbalizes understanding and agreement.    Final Clinical Impressions(s) / UC Diagnoses   Final diagnoses:  Acute cystitis with hematuria  Viral URI with cough     Discharge Instructions      -Start the antibiotic: Keflex, 4x daily x5 days. You can take this with food if you have a sensitive stomach. -Pyridium as needed -For your yeast infection, start the Diflucan (fluconazole)- Take one pill today (day 1). If you're still having symptoms in 3 days, take the second pill.  -Follow-up if symptoms worsen or persist.     ED Prescriptions     Medication Sig Dispense Auth. Provider   cephALEXin (KEFLEX) 500 MG capsule Take 1 capsule (500 mg total) by mouth 4 (four) times daily. 20 capsule Rhys Martini, PA-C   fluconazole (DIFLUCAN) 150 MG tablet Take 1 tablet (150 mg total) by mouth daily. -For your yeast infection, start the Diflucan (fluconazole)- Take one pill today (day 1). If you're still having symptoms in 3 days,  take the second pill. 2 tablet Rhys Martini, PA-C   phenazopyridine  (PYRIDIUM) 100 MG tablet Take 1 tablet (100 mg total) by mouth 3 (three) times daily as needed for pain. 10 tablet Rhys Martini, PA-C      PDMP not reviewed this encounter.   Rhys Martini, PA-C 04/05/21 2013

## 2021-04-05 NOTE — ED Triage Notes (Signed)
Pt is here with chills and sore throat, congestion started today. Pt states she has frequency to urinate with burning that started 5 days ago. Pt has taken AZO and Advil to relieve discomfort.

## 2021-04-05 NOTE — Discharge Instructions (Addendum)
-  Start the antibiotic: Keflex, 4x daily x5 days. You can take this with food if you have a sensitive stomach. -Pyridium as needed -For your yeast infection, start the Diflucan (fluconazole)- Take one pill today (day 1). If you're still having symptoms in 3 days, take the second pill.  -Follow-up if symptoms worsen or persist.

## 2021-04-08 ENCOUNTER — Other Ambulatory Visit: Payer: Self-pay

## 2021-04-08 ENCOUNTER — Encounter (HOSPITAL_COMMUNITY): Payer: Self-pay

## 2021-04-08 ENCOUNTER — Ambulatory Visit (HOSPITAL_COMMUNITY)
Admission: EM | Admit: 2021-04-08 | Discharge: 2021-04-08 | Disposition: A | Discharge status: Home / Self Care | Payer: Medicaid Other | Attending: Internal Medicine | Admitting: Internal Medicine

## 2021-04-08 DIAGNOSIS — Z1152 Encounter for screening for COVID-19: Secondary | ICD-10-CM | POA: Insufficient documentation

## 2021-04-08 DIAGNOSIS — J029 Acute pharyngitis, unspecified: Secondary | ICD-10-CM | POA: Insufficient documentation

## 2021-04-08 LAB — URINE CULTURE: Culture: >=100000 — AB

## 2021-04-08 LAB — SARS CORONAVIRUS 2 (TAT 6-24 HRS): SARS Coronavirus 2: NEGATIVE

## 2021-04-08 LAB — POCT RAPID STREP A, ED / UC: Streptococcus, Group A Screen (Direct): NEGATIVE

## 2021-04-08 MED ORDER — BENZONATATE 100 MG PO CAPS: 100.0000 mg | ORAL_CAPSULE | Freq: Three times a day (TID) | ORAL | 0 refills | Status: AC | PRN

## 2021-04-08 NOTE — Discharge Instructions (Addendum)
You can try warm salt water gargles to help relieve throat discomfort.  Use Flonase daily and cetirizine 10 mg daily as needed.  This will help with your sore throat and congestion.  I have prescribed Tessalon Perles as needed for cough.  Make sure you are drinking plenty of fluids.  We have also screened you today for COVID-19 though you would be at the end of your isolation.  These results should be available in MyChart in the next 1 to 2 days.  We will let you know if your throat culture results show strep.  Otherwise, please return for any worsening or persistent symptoms.

## 2021-04-08 NOTE — ED Provider Notes (Signed)
[facility]    CSN: [id number] Arrival date & time: 04/08/21  4132      History   Chief Complaint Chief Complaint  Patient presents with   Sore Throat    HPI Pamela Castro is a 35 y.o. female seen initially on 9/20 presents urgent care with continued sore throat.  Patient initially seen for UTI and URI.  She states urinary symptoms have completely resolved.  Now with worsening sore throat, nasal congestion and cough.  Patient states symptoms appear to be worse in the morning.  She denies any fever but reports frequent chills.  No headache, dizziness, facial pain, chest pain, SOB, abdominal pain, N/V/D.  Patient son and daughter with similar illness a few weeks prior.  Taking Tylenol and Motrin with little relief.   Past Medical History:  Diagnosis Date   Gestational hypertension     There are no problems to display for this patient.   Past Surgical History:  Procedure Laterality Date   TONSILLECTOMY      OB History   No obstetric history on file.      Home Medications    Prior to Admission medications   Medication Sig Start Date End Date Taking? Authorizing Provider  benzonatate (TESSALON PERLES) 100 MG capsule Take 1 capsule (100 mg total) by mouth 3 (three) times daily as needed for cough. 04/08/21 04/08/22 Yes Rolla Etienne, NP  albuterol (VENTOLIN HFA) 108 (90 Base) MCG/ACT inhaler Inhale 1-2 puffs into the lungs every 6 (six) hours as needed for wheezing or shortness of breath. 05/23/20   Particia Nearing, PA-C  albuterol (VENTOLIN HFA) 108 (90 Base) MCG/ACT inhaler Inhale 1-2 puffs into the lungs every 4 (four) hours as needed for wheezing or shortness of breath. 06/02/20   Domenick Gong, MD  Norethindrone Acetate-Ethinyl Estradiol (JUNEL 1.5/30) 1.5-30 MG-MCG tablet Junel FE 1.5/30 (28) 1.5 mg-30 mcg (21)/75 mg (7) tablet  Take 1 tablet every day by oral route for 84 days.    [provider]  phenazopyridine (PYRIDIUM) 100 MG  tablet Take 1 tablet (100 mg total) by mouth 3 (three) times daily as needed for pain. 04/05/21   Rhys Martini, PA-C  promethazine-dextromethorphan (PROMETHAZINE-DM) 6.25-15 MG/5ML syrup Take 5 mLs by mouth 4 (four) times daily as needed for cough. 10/28/20   Particia Nearing, PA-C  Spacer/Aero-Holding Chambers (AEROCHAMBER PLUS) inhaler Use with inhaler 06/02/20   Domenick Gong, MD    Family History Family History  Problem Relation Age of Onset   Healthy Mother    Cancer Father    Hyperlipidemia Father    Hypertension Father    Diabetes Father     Social History Social History   Tobacco Use   Smoking status: Never   Smokeless tobacco: Never  Vaping Use   Vaping Use: Never used  Substance Use Topics   Alcohol use: Yes   Drug use: Never     Allergies   Patient has no known allergies.   Review of Systems As stated in HPI otherwise negative   Physical Exam Triage Vital Signs ED Triage Vitals [04/08/21 0853]  Enc Vitals Group     BP (!) 154/99     Pulse Rate 76     Resp 18     Temp 99.1 F (37.3 C)     Temp Source Oral     SpO2 98 %     Weight      Height      Head Circumference  Peak Flow      Pain Score 8     Pain Loc      Pain Edu?      Excl. in GC?    No data found.  Updated Vital Signs BP (!) 154/99 (BP Location: Left Arm)   Pulse 76   Temp 99.1 F (37.3 C) (Oral)   Resp 18   LMP 04/01/2021   SpO2 98%   Visual Acuity Right Eye Distance:   Left Eye Distance:   Bilateral Distance:    Right Eye Near:   Left Eye Near:    Bilateral Near:     Physical Exam Constitutional:      General: She is not in acute distress.    Appearance: She is well-developed. She is not ill-appearing or toxic-appearing.  HENT:     Right Ear: A middle ear effusion is present. Tympanic membrane is not erythematous.     Left Ear: A middle ear effusion is present. Tympanic membrane is not erythematous.     Nose: Congestion present. No rhinorrhea.      Mouth/Throat:     Mouth: Mucous membranes are moist.     Pharynx: Uvula midline. Posterior oropharyngeal erythema present. No pharyngeal swelling or oropharyngeal exudate.     Comments: Scattered erythematous sores on posterior pharynx, no exudate.  Eyes:     Conjunctiva/sclera: Conjunctivae normal.  Cardiovascular:     Rate and Rhythm: Normal rate and regular rhythm.     Heart sounds: No murmur heard.   No friction rub. No gallop.  Pulmonary:     Effort: Pulmonary effort is normal.     Breath sounds: Normal breath sounds.  Abdominal:     General: Bowel sounds are normal.     Palpations: Abdomen is soft.  Musculoskeletal:     Cervical back: Normal range of motion and neck supple.  Lymphadenopathy:     Cervical: No cervical adenopathy.  Skin:    General: Skin is warm and dry.  Neurological:     General: No focal deficit present.     Mental Status: She is alert and oriented to person, place, and time.  Psychiatric:        Mood and Affect: Mood normal.        Behavior: Behavior normal.     UC Treatments / Results  Labs (all labs ordered are listed, but only abnormal results are displayed) Labs Reviewed  CULTURE, GROUP A STREP ([facility])  SARS CORONAVIRUS 2 (TAT 6-24 HRS)  POCT RAPID STREP A, ED / UC    EKG   Radiology No results found.  Procedures Procedures (including critical care time)  Medications Ordered in UC Medications - No data to display  Initial Impression / Assessment and Plan / UC Course  I have reviewed the triage vital signs and the nursing notes.  Pertinent labs & imaging results that were available during my care of the patient were reviewed by me and considered in my medical decision making (see chart for details).  Pharyngitis -Suspect viral in nature based on exam.  Children with similar recent illness.  VSS and nontoxic-appearing -COVID PCR sent.  Rapid strep negative.  Will send for culture and follow-up as needed -Symptomatic treatment with  warm salt water gargles, Tessalon Perles as needed -Return for worsening or persistent symptoms  Reviewed expections re: course of current medical issues. Questions answered. Outlined signs and symptoms indicating need for more acute intervention. Pt verbalized understanding. AVS given  Final Clinical Impressions(s) / UC  Diagnoses   Final diagnoses:  Viral pharyngitis  Encounter for screening for COVID-19     Discharge Instructions      You can try warm salt water gargles to help relieve throat discomfort.  Use Flonase daily and cetirizine 10 mg daily as needed.  This will help with your sore throat and congestion.  I have prescribed Tessalon Perles as needed for cough.  Make sure you are drinking plenty of fluids.  We have also screened you today for COVID-19 though you would be at the end of your isolation.  These results should be available in MyChart in the next 1 to 2 days.  We will let you know if your throat culture results show strep.  Otherwise, please return for any worsening or persistent symptoms.     ED Prescriptions     Medication Sig Dispense Auth. Provider   benzonatate (TESSALON PERLES) 100 MG capsule Take 1 capsule (100 mg total) by mouth 3 (three) times daily as needed for cough. 30 capsule Rolla Etienne, NP      PDMP not reviewed this encounter.   Rolla Etienne, NP 04/08/21 1304

## 2021-04-08 NOTE — ED Triage Notes (Signed)
Pt c/o sore throat with difficulty swallowing x1wk with a cough. States pain has worsen since was seen here on Tuesday.

## 2021-04-10 LAB — CULTURE, GROUP A STREP (THRC)

## 2023-04-17 ENCOUNTER — Encounter (HOSPITAL_COMMUNITY): Payer: Self-pay | Admitting: *Deleted

## 2023-04-17 ENCOUNTER — Ambulatory Visit (HOSPITAL_COMMUNITY)
Admission: EM | Admit: 2023-04-17 | Discharge: 2023-04-17 | Disposition: A | Discharge status: Home / Self Care | Payer: 59 | Attending: Emergency Medicine | Admitting: Emergency Medicine

## 2023-04-17 DIAGNOSIS — N3001 Acute cystitis with hematuria: Secondary | ICD-10-CM | POA: Diagnosis not present

## 2023-04-17 LAB — POCT URINALYSIS DIP (MANUAL ENTRY)
Bilirubin, UA: NEGATIVE
Glucose, UA: NEGATIVE mg/dL
Nitrite, UA: NEGATIVE
Protein Ur, POC: NEGATIVE mg/dL
Spec Grav, UA: 1.025 (ref 1.010–1.025)
Urobilinogen, UA: 0.2 U/dL
pH, UA: 7 (ref 5.0–8.0)

## 2023-04-17 LAB — POCT URINE PREGNANCY: Preg Test, Ur: NEGATIVE

## 2023-04-17 MED ORDER — SULFAMETHOXAZOLE-TRIMETHOPRIM 800-160 MG PO TABS: 1.0000 | ORAL_TABLET | Freq: Two times a day (BID) | ORAL | 0 refills | Status: AC

## 2023-04-17 MED ORDER — FLUCONAZOLE 150 MG PO TABS: 150.0000 mg | ORAL_TABLET | Freq: Once | ORAL | 0 refills | Status: DC | PRN

## 2023-04-17 NOTE — Discharge Instructions (Addendum)
Please take medication as prescribed. Take with food to avoid upset stomach. Drink LOTS of fluids! We will call you if anything on urine culture requires a change in therapy (2-3 days)  Please call the clinic below to set up appointment for follow up, and to establish care.

## 2023-04-17 NOTE — ED Provider Notes (Addendum)
[facility]    CSN: [id number] Arrival date & time: 04/17/23  1418     History   Chief Complaint Chief Complaint  Patient presents with   Urinary Frequency   HPI Pamela Castro is a 37 y.o. female.  Here with 3 day history of dysuria, suprapubic pain, urinary frequency History of UTI similar to this  Not having nausea or vomiting No vaginal discharge/odor  Reports frequent UTI after intercourse Has an IUD Has a PCP but high copay  Past Medical History:  Diagnosis Date   Gestational hypertension     There are no problems to display for this patient.   Past Surgical History:  Procedure Laterality Date   TONSILLECTOMY      OB History   No obstetric history on file.      Home Medications    Prior to Admission medications   Medication Sig Start Date End Date Taking? Authorizing Provider  fluconazole (DIFLUCAN) 150 MG tablet Take 1 tablet (150 mg total) by mouth once as needed for up to 2 doses (take one pill on day 1, and the second pill 3 days later). 04/17/23  Yes Indigo Chaddock, Lurena Joiner, PA-C  sulfamethoxazole-trimethoprim (BACTRIM DS) 800-160 MG tablet Take 1 tablet by mouth 2 (two) times daily for 3 days. 04/17/23 04/20/23 Yes Gavin Faivre, Lurena Joiner, PA-C  albuterol (VENTOLIN HFA) 108 (90 Base) MCG/ACT inhaler Inhale 1-2 puffs into the lungs every 6 (six) hours as needed for wheezing or shortness of breath. 05/23/20   Particia Nearing, PA-C  albuterol (VENTOLIN HFA) 108 (90 Base) MCG/ACT inhaler Inhale 1-2 puffs into the lungs every 4 (four) hours as needed for wheezing or shortness of breath. 06/02/20   Domenick Gong, MD  Spacer/Aero-Holding Chambers (AEROCHAMBER PLUS) inhaler Use with inhaler 06/02/20   Domenick Gong, MD    Family History Family History  Problem Relation Age of Onset   Healthy Mother    Cancer Father    Hyperlipidemia Father    Hypertension Father    Diabetes Father     Social History Social History   Tobacco Use    Smoking status: Never   Smokeless tobacco: Never  Vaping Use   Vaping status: Never Used  Substance Use Topics   Alcohol use: Yes   Drug use: Never     Allergies   Patient has no known allergies.   Review of Systems Review of Systems Per HPI  Physical Exam Triage Vital Signs ED Triage Vitals  Encounter Vitals Group     BP 04/17/23 1446 (!) 147/89     Systolic BP Percentile --      Diastolic BP Percentile --      Pulse Rate 04/17/23 1446 71     Resp 04/17/23 1446 18     Temp 04/17/23 1446 98.2 F (36.8 C)     Temp Source 04/17/23 1446 Oral     SpO2 04/17/23 1446 99 %     Weight --      Height --      Head Circumference --      Peak Flow --      Pain Score 04/17/23 1445 0     Pain Loc --      Pain Education --      Exclude from Growth Chart --    No data found.  Updated Vital Signs BP (!) 147/89 (BP Location: Left Arm)   Pulse 71   Temp 98.2 F (36.8 C) (Oral)   Resp 18   SpO2  99%   Physical Exam Vitals and nursing note reviewed.  Constitutional:      General: She is not in acute distress. HENT:     Mouth/Throat:     Mouth: Mucous membranes are moist.     Pharynx: Oropharynx is clear.  Eyes:     Conjunctiva/sclera: Conjunctivae normal.     Pupils: Pupils are equal, round, and reactive to light.  Cardiovascular:     Rate and Rhythm: Normal rate and regular rhythm.     Heart sounds: Normal heart sounds.  Pulmonary:     Effort: Pulmonary effort is normal.     Breath sounds: Normal breath sounds.  Abdominal:     General: Bowel sounds are normal.     Palpations: Abdomen is soft.     Tenderness: There is no abdominal tenderness. There is no right CVA tenderness, left CVA tenderness or guarding.  Neurological:     Mental Status: She is alert and oriented to person, place, and time.     UC Treatments / Results  Labs (all labs ordered are listed, but only abnormal results are displayed) Labs Reviewed  POCT URINALYSIS DIP (MANUAL ENTRY) - Abnormal;  Notable for the following components:      Result Value   Clarity, UA cloudy (*)    Ketones, POC UA small (15) (*)    Blood, UA moderate (*)    Leukocytes, UA Small (1+) (*)    All other components within normal limits  POCT URINE PREGNANCY - Normal  URINE CULTURE    EKG  Radiology No results found.  Procedures Procedures (including critical care time)  Medications Ordered in UC Medications - No data to display  Initial Impression / Assessment and Plan / UC Course  I have reviewed the triage vital signs and the nursing notes.  Pertinent labs & imaging results that were available during my care of the patient were reviewed by me and considered in my medical decision making (see chart for details).  UPT negative UA moderate RBC, small leuks Culture is pending. With symptoms, treat for acute cystitis; Bactrim BID x 3 days. Advised increase fluids.  Contact community health and wellness to set up appointment for follow-up. Can return if needed  Patient also reporting history of antibiotic induced yeast infection.  Sent 2 dose fluconazole  Final Clinical Impressions(s) / UC Diagnoses   Final diagnoses:  Acute cystitis with hematuria     Discharge Instructions      Please take medication as prescribed. Take with food to avoid upset stomach. Drink LOTS of fluids! We will call you if anything on urine culture requires a change in therapy (2-3 days)  Please call the clinic below to set up appointment for follow up, and to establish care.    ED Prescriptions     Medication Sig Dispense Auth. Provider   sulfamethoxazole-trimethoprim (BACTRIM DS) 800-160 MG tablet Take 1 tablet by mouth 2 (two) times daily for 3 days. 6 tablet Rema Lievanos, PA-C   fluconazole (DIFLUCAN) 150 MG tablet Take 1 tablet (150 mg total) by mouth once as needed for up to 2 doses (take one pill on day 1, and the second pill 3 days later). 2 tablet Evi Mccomb, Lurena Joiner, PA-C      PDMP not reviewed  this encounter.   Keliyah Spillman, [place] 04/17/23 1602    Demondre Aguas, Lurena Joiner, PA-C 04/17/23 [phone]

## 2023-04-17 NOTE — ED Triage Notes (Signed)
Pt states she has had urinary frequency and burning x 3 days. She uses boric acid tablets but no relief.

## 2023-04-19 LAB — URINE CULTURE: Culture: <10000 — AB

## 2023-05-28 ENCOUNTER — Encounter (HOSPITAL_COMMUNITY): Payer: Self-pay | Admitting: Emergency Medicine

## 2023-05-28 ENCOUNTER — Ambulatory Visit (HOSPITAL_COMMUNITY)
Admission: EM | Admit: 2023-05-28 | Discharge: 2023-05-28 | Disposition: A | Discharge status: Home / Self Care | Payer: 59 | Attending: Emergency Medicine | Admitting: Emergency Medicine

## 2023-05-28 DIAGNOSIS — J029 Acute pharyngitis, unspecified: Secondary | ICD-10-CM | POA: Diagnosis present

## 2023-05-28 LAB — POCT RAPID STREP A (OFFICE): Rapid Strep A Screen: NEGATIVE

## 2023-05-28 MED ORDER — LIDOCAINE VISCOUS HCL 2 % MT SOLN: 15.0000 mL | OROMUCOSAL | 0 refills | Status: DC | PRN

## 2023-05-28 MED ORDER — IBUPROFEN 800 MG PO TABS
800.0000 mg | ORAL_TABLET | Freq: Once | ORAL | Status: AC
  Administered 2023-05-28: 800 mg via ORAL

## 2023-05-28 MED ORDER — IBUPROFEN 800 MG PO TABS
ORAL_TABLET | ORAL | Status: AC
  Filled 2023-05-28: qty 1

## 2023-05-28 MED ORDER — IBUPROFEN 800 MG PO TABS: 800.0000 mg | ORAL_TABLET | Freq: Three times a day (TID) | ORAL | 0 refills | Status: DC

## 2023-05-28 NOTE — Discharge Instructions (Addendum)
Ibuprofen can be taken with food ever 6 hours. Make sure you are drinking lots of fluids! Salt water gargles, lozenges, or throat spray We will call you if anything returns on your throat culture (2-3 days) Lidocaine gargle as often as needed

## 2023-05-28 NOTE — ED Triage Notes (Signed)
Pt c/o sore throat and hoarseness since yesterday. States it feels like she is "swallowing glass"

## 2023-05-28 NOTE — ED Provider Notes (Signed)
[facility]    CSN: [id number] Arrival date & time: 05/28/23  2202      History   Chief Complaint Chief Complaint  Patient presents with   Sore Throat    HPI Luvern Comins is a 37 y.o. female.  Sore throat started this morning Rated 9/10 pain with swallowing No fever, chills, congestion, cough. No GI symptoms Has not taken medications yet  Several possible sick contacts recently   History tonsillectomy   Past Medical History:  Diagnosis Date   Gestational hypertension     There are no problems to display for this patient.   Past Surgical History:  Procedure Laterality Date   TONSILLECTOMY      OB History   No obstetric history on file.      Home Medications    Prior to Admission medications   Medication Sig Start Date End Date Taking? Authorizing Provider  ibuprofen (ADVIL) 800 MG tablet Take 1 tablet (800 mg total) by mouth 3 (three) times daily. 05/28/23  Yes Jc Veron, PA-C  lidocaine (XYLOCAINE) 2 % solution Use as directed 15 mLs in the mouth or throat every 3 (three) hours as needed for mouth pain. Swish/gargle and spit out 05/28/23  Yes Jenavee Laguardia, Lurena Joiner, PA-C  albuterol (VENTOLIN HFA) 108 (90 Base) MCG/ACT inhaler Inhale 1-2 puffs into the lungs every 6 (six) hours as needed for wheezing or shortness of breath. 05/23/20   Particia Nearing, PA-C  albuterol (VENTOLIN HFA) 108 (90 Base) MCG/ACT inhaler Inhale 1-2 puffs into the lungs every 4 (four) hours as needed for wheezing or shortness of breath. 06/02/20   Domenick Gong, MD  fluconazole (DIFLUCAN) 150 MG tablet Take 1 tablet (150 mg total) by mouth once as needed for up to 2 doses (take one pill on day 1, and the second pill 3 days later). 04/17/23   Denai Caba, Lurena Joiner, PA-C  Spacer/Aero-Holding Chambers (AEROCHAMBER PLUS) inhaler Use with inhaler 06/02/20   Domenick Gong, MD    Family History Family History  Problem Relation Age of Onset   Healthy Mother     Cancer Father    Hyperlipidemia Father    Hypertension Father    Diabetes Father     Social History Social History   Tobacco Use   Smoking status: Never   Smokeless tobacco: Never  Vaping Use   Vaping status: Never Used  Substance Use Topics   Alcohol use: Yes   Drug use: Never     Allergies   Patient has no known allergies.   Review of Systems Review of Systems Per HPI  Physical Exam Triage Vital Signs ED Triage Vitals  Encounter Vitals Group     BP 05/28/23 1037 (!) 150/98     Systolic BP Percentile --      Diastolic BP Percentile --      Pulse Rate 05/28/23 1037 93     Resp 05/28/23 1037 16     Temp 05/28/23 1037 97.9 F (36.6 C)     Temp Source 05/28/23 1037 Oral     SpO2 05/28/23 1037 98 %     Weight --      Height --      Head Circumference --      Peak Flow --      Pain Score 05/28/23 1040 8     Pain Loc --      Pain Education --      Exclude from Growth Chart --    No data found.  Updated Vital Signs BP (!) 150/98 (BP Location: Right Arm)   Pulse 93   Temp 97.9 F (36.6 C) (Oral)   Resp 16   LMP 05/28/2023 (Approximate)   SpO2 98%    Physical Exam Vitals and nursing note reviewed.  Constitutional:      General: She is not in acute distress.    Appearance: She is not ill-appearing.     Comments: Normal phonation, tolerating secretions   HENT:     Right Ear: Tympanic membrane and ear canal normal.     Left Ear: Tympanic membrane and ear canal normal.     Nose: No rhinorrhea.     Mouth/Throat:     Mouth: Mucous membranes are moist.     Pharynx: Oropharynx is clear. Uvula midline. No pharyngeal swelling, oropharyngeal exudate, posterior oropharyngeal erythema or uvula swelling.     Tonsils: 0 on the right. 0 on the left.     Comments: No tonsils. Mild erythema  Eyes:     Conjunctiva/sclera: Conjunctivae normal.  Cardiovascular:     Rate and Rhythm: Normal rate and regular rhythm.     Pulses: Normal pulses.     Heart sounds: Normal  heart sounds.  Pulmonary:     Effort: Pulmonary effort is normal.     Breath sounds: Normal breath sounds.  Musculoskeletal:     Cervical back: Normal range of motion.  Lymphadenopathy:     Cervical: No cervical adenopathy.  Skin:    General: Skin is warm and dry.  Neurological:     Mental Status: She is alert and oriented to person, place, and time.     UC Treatments / Results  Labs (all labs ordered are listed, but only abnormal results are displayed) Labs Reviewed  CULTURE, GROUP A STREP [facility])  POCT RAPID STREP A (OFFICE)    EKG   Radiology No results found.  Procedures Procedures (including critical care time)  Medications Ordered in UC Medications  ibuprofen (ADVIL) tablet 800 mg (800 mg Oral Given 05/28/23 1156)    Initial Impression / Assessment and Plan / UC Course  I have reviewed the triage vital signs and the nursing notes.  Pertinent labs & imaging results that were available during my care of the patient were reviewed by me and considered in my medical decision making (see chart for details).  Ibu dose given in clinic Rapid strep negative. Culture is pending.  Discussed viral etiology, symptomatic care Return precautions discussed. Patient agrees to plan  Final Clinical Impressions(s) / UC Diagnoses   Final diagnoses:  Viral pharyngitis     Discharge Instructions      Ibuprofen can be taken with food ever 6 hours. Make sure you are drinking lots of fluids! Salt water gargles, lozenges, or throat spray We will call you if anything returns on your throat culture (2-3 days) Lidocaine gargle as often as needed    ED Prescriptions     Medication Sig Dispense Auth. Provider   lidocaine (XYLOCAINE) 2 % solution Use as directed 15 mLs in the mouth or throat every 3 (three) hours as needed for mouth pain. Swish/gargle and spit out 100 mL Glorimar Stroope, PA-C   ibuprofen (ADVIL) 800 MG tablet Take 1 tablet (800 mg total) by mouth 3 (three)  times daily. 21 tablet Jacolby Risby, Lurena Joiner, PA-C      PDMP not reviewed this encounter.   Marlow Baars, [place] 05/28/23 1216

## 2023-05-30 ENCOUNTER — Encounter (HOSPITAL_COMMUNITY): Payer: Self-pay

## 2023-05-30 ENCOUNTER — Ambulatory Visit (HOSPITAL_COMMUNITY)
Admission: EM | Admit: 2023-05-30 | Discharge: 2023-05-30 | Disposition: A | Discharge status: Home / Self Care | Payer: 59 | Attending: Emergency Medicine | Admitting: Emergency Medicine

## 2023-05-30 ENCOUNTER — Ambulatory Visit (HOSPITAL_COMMUNITY): Payer: 59

## 2023-05-30 DIAGNOSIS — R0602 Shortness of breath: Secondary | ICD-10-CM

## 2023-05-30 DIAGNOSIS — U071 COVID-19: Secondary | ICD-10-CM

## 2023-05-30 LAB — POC COVID19/FLU A&B COMBO
Covid Antigen, POC: POSITIVE — AB
Influenza A Antigen, POC: NEGATIVE
Influenza B Antigen, POC: NEGATIVE

## 2023-05-30 MED ORDER — ALBUTEROL SULFATE HFA 108 (90 BASE) MCG/ACT IN AERS: 1.0000 | INHALATION_SPRAY | Freq: Four times a day (QID) | RESPIRATORY_TRACT | 0 refills | Status: AC | PRN

## 2023-05-30 MED ORDER — BENZONATATE 100 MG PO CAPS: 200.0000 mg | ORAL_CAPSULE | Freq: Three times a day (TID) | ORAL | 0 refills | Status: DC

## 2023-05-30 NOTE — ED Triage Notes (Signed)
Patient states she was seen on 05/28/23 for a sore throat. Patient states she is now experiencing headache, generalized body aches, and a non productive cough as well as the sore throat.  Paatient states she has been taking Ibuprofen.

## 2023-05-30 NOTE — ED Provider Notes (Signed)
[facility]    CSN: [id number] Arrival date & time: 05/30/23  1002      History   Chief Complaint Chief Complaint  Patient presents with   Sore Throat   Headache   Generalized Body Aches   Cough    HPI Pamela Castro is a 37 y.o. female.   Patient presents to clinic for continued sore throat.  Symptoms have now worsened over the past few days, she now has bodyaches in her back and her knees, feels like she is swallowing rocks, feels her head is going to explode, runny stuffy nose and a slightly productive cough with a little bit of shortness of breath that started yesterday.  Some wheezing. No hx of asthma.   She has been taking ibuprofen for symptoms but this has not been helping.  She did trial the lidocaine yesterday, but threw it up.  She was seen in clinic on 11/11 and had negative strep testing.  Throat culture is still pending.  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  Her mother is now sick.  Temp of 99.2 in clinic.   The history is provided by the patient and medical records.  Sore Throat  Headache Cough   Past Medical History:  Diagnosis Date   Gestational hypertension     There are no problems to display for this patient.   Past Surgical History:  Procedure Laterality Date   TONSILLECTOMY      OB History   No obstetric history on file.      Home Medications    Prior to Admission medications   Medication Sig Start Date End Date Taking? Authorizing Provider  albuterol (VENTOLIN HFA) 108 (90 Base) MCG/ACT inhaler Inhale 1-2 puffs into the lungs every 6 (six) hours as needed. 05/30/23  Yes Rinaldo Ratel, [place], FNP  benzonatate (TESSALON) 100 MG capsule Take 2 capsules (200 mg total) by mouth every 8 (eight) hours. 05/30/23  Yes Rinaldo Ratel, [place] N, FNP  ibuprofen (ADVIL) 800 MG tablet Take 1 tablet (800 mg total) by mouth 3 (three) times daily. 05/28/23   Rising, Lurena Joiner PA-C    Family History Family  History  Problem Relation Age of Onset   Healthy Mother    Cancer Father    Hyperlipidemia Father    Hypertension Father    Diabetes Father     Social History Social History   Tobacco Use   Smoking status: Never   Smokeless tobacco: Never  Vaping Use   Vaping status: Never Used  Substance Use Topics   Alcohol use: Not Currently   Drug use: Never     Allergies   Patient has no known allergies.   Review of Systems Review of Systems   Physical Exam Triage Vital Signs ED Triage Vitals  Encounter Vitals Group     BP 05/30/23 1052 (!) 140/103     Systolic BP Percentile --      Diastolic BP Percentile --      Pulse Rate 05/30/23 1052 99     Resp 05/30/23 1052 16     Temp 05/30/23 1052 99.2 F (37.3 C)     Temp Source 05/30/23 1052 Oral     SpO2 05/30/23 1052 97 %     Weight --      Height --      Head Circumference --      Peak Flow --      Pain Score 05/30/23 1051 10  Pain Loc --      Pain Education --      Exclude from Growth Chart --    No data found.  Updated Vital Signs BP (!) 140/103 (BP Location: Right Arm)   Pulse 99   Temp 99.2 F (37.3 C) (Oral)   Resp 16   LMP 05/28/2023 (Approximate)   SpO2 97%   Visual Acuity Right Eye Distance:   Left Eye Distance:   Bilateral Distance:    Right Eye Near:   Left Eye Near:    Bilateral Near:     Physical Exam Vitals and nursing note reviewed.  Constitutional:      Appearance: She is well-developed.  HENT:     Head: Normocephalic and atraumatic.     Nose: Congestion present. No rhinorrhea.     Mouth/Throat:     Mouth: Mucous membranes are moist.     Pharynx: Uvula midline. Posterior oropharyngeal erythema present.     Comments: Tonsils are surgically absent. Eyes:     Conjunctiva/sclera: Conjunctivae normal.  Cardiovascular:     Rate and Rhythm: Normal rate and regular rhythm.     Heart sounds: Normal heart sounds. No murmur heard. Pulmonary:     Effort: Pulmonary effort is normal.      Breath sounds: Wheezing and rhonchi present.  Musculoskeletal:     Cervical back: Normal range of motion.  Lymphadenopathy:     Cervical: Cervical adenopathy present.  Skin:    General: Skin is warm and dry.  Neurological:     General: No focal deficit present.     Mental Status: She is alert.  Psychiatric:        Mood and Affect: Mood normal.      UC Treatments / Results  Labs (all labs ordered are listed, but only abnormal results are displayed) Labs Reviewed  POC COVID19/FLU A&B COMBO - Abnormal; Notable for the following components:      Result Value   Covid Antigen, POC Positive (*)    All other components within normal limits    EKG   Radiology No results found.  Procedures Procedures (including critical care time)  Medications Ordered in UC Medications - No data to display  Initial Impression / Assessment and Plan / UC Course  I have reviewed the triage vital signs and the nursing notes.  Pertinent labs & imaging results that were available during my care of the patient were reviewed by me and considered in my medical decision making (see chart for details).  Vitals and triage reviewed, patient is hemodynamically stable.  Lungs with some minor expiratory wheezing.  Nasal congestion and posterior pharynx erythema present.  Throat culture pending from previous visit.  Chest x-ray does not show any obvious infiltrates, official radiology overread is pending and I will contact patient if results require treatment.  POC COVID and flu testing revealed positive COVID-19 infection.  This explains her symptoms.  Discussed symptomatic management for viral URI.  Plan of care, follow-up care and return precautions given, no questions at this time.  Work note provided.     Final Clinical Impressions(s) / UC Diagnoses   Final diagnoses:  COVID-19 virus infection     Discharge Instructions      You tested positive for COVID-19.  This is a viral infection with no cure.   For your sore throat you can do warm saline gargles, tea with honey and sleep with a humidifier.   For generalized body aches, pains and headache you can  alternate between 800 mg of ibuprofen and 500 mg of Tylenol every 4-6 hours.  Take the cough medicine as needed, do not drink or drive on this medication as it may cause drowsiness. Use the albuterol inhaler as needed for wheezing or shortness of breath.   Your chest x-ray did not indicate any obvious bacterial pneumonia, I will contact you if the radiologist interprets this differently.  Symptoms should improve gradually over the next 5 to 7 days.  If no improvement or any changes please return to clinic.      ED Prescriptions     Medication Sig Dispense Auth. Provider   albuterol (VENTOLIN HFA) 108 (90 Base) MCG/ACT inhaler Inhale 1-2 puffs into the lungs every 6 (six) hours as needed. 18 g Rinaldo Ratel, [place] N, FNP   benzonatate (TESSALON) 100 MG capsule Take 2 capsules (200 mg total) by mouth every 8 (eight) hours. 21 capsule Taneka Espiritu, [place] N, [place]      PDMP not reviewed this encounter.   Asiah Befort, [place] N, [place] 05/30/23 [phone]

## 2023-05-30 NOTE — Discharge Instructions (Addendum)
You tested positive for COVID-19.  This is a viral infection with no cure.  For your sore throat you can do warm saline gargles, tea with honey and sleep with a humidifier.   For generalized body aches, pains and headache you can alternate between 800 mg of ibuprofen and 500 mg of Tylenol every 4-6 hours.  Take the cough medicine as needed, do not drink or drive on this medication as it may cause drowsiness. Use the albuterol inhaler as needed for wheezing or shortness of breath.   Your chest x-ray did not indicate any obvious bacterial pneumonia, I will contact you if the radiologist interprets this differently.  Symptoms should improve gradually over the next 5 to 7 days.  If no improvement or any changes please return to clinic.

## 2023-05-31 LAB — CULTURE, GROUP A STREP (THRC)

## 2023-08-12 ENCOUNTER — Ambulatory Visit (HOSPITAL_COMMUNITY)
Admission: EM | Admit: 2023-08-12 | Discharge: 2023-08-12 | Disposition: A | Discharge status: Home / Self Care | Payer: 59 | Attending: Emergency Medicine | Admitting: Emergency Medicine

## 2023-08-12 DIAGNOSIS — Z113 Encounter for screening for infections with a predominantly sexual mode of transmission: Secondary | ICD-10-CM | POA: Insufficient documentation

## 2023-08-12 DIAGNOSIS — R300 Dysuria: Secondary | ICD-10-CM | POA: Diagnosis present

## 2023-08-12 LAB — POCT URINALYSIS DIP (MANUAL ENTRY)
Bilirubin, UA: NEGATIVE
Blood, UA: NEGATIVE
Glucose, UA: NEGATIVE mg/dL
Leukocytes, UA: NEGATIVE
Nitrite, UA: NEGATIVE
Spec Grav, UA: >=1.03 — AB (ref 1.010–1.025)
Urobilinogen, UA: 0.2 U/dL
pH, UA: 6 (ref 5.0–8.0)

## 2023-08-12 LAB — POCT URINE PREGNANCY: Preg Test, Ur: NEGATIVE

## 2023-08-12 LAB — HIV ANTIBODY (ROUTINE TESTING W REFLEX): HIV Screen 4th Generation wRfx: NONREACTIVE

## 2023-08-12 MED ORDER — PHENAZOPYRIDINE HCL 100 MG PO TABS: 100.0000 mg | ORAL_TABLET | Freq: Three times a day (TID) | ORAL | 0 refills | Status: AC | PRN

## 2023-08-12 NOTE — ED Triage Notes (Signed)
Patient presents to the office for dysuria x 3 days.

## 2023-08-12 NOTE — Discharge Instructions (Addendum)
We will call you if anything on your swab or blood work returns positive. You can also see these results on MyChart. Please abstain from sexual intercourse until your results return.  Try the pyridium 2-3 times daily for no longer than 2 days. This can help with urinary pain. Avoid sugar and caffeine.  Keep drinking lots of water!  Call the urology clinic first thing in the morning to make an appointment

## 2023-08-12 NOTE — ED Provider Notes (Signed)
[facility]    CSN: [id number] Arrival date & time: 08/12/23  1650     History   Chief Complaint Chief Complaint  Patient presents with   Urinary Tract Infection    HPI Pamela Castro is a 38 y.o. female.  Reporting 3 day history of dysuria  Not having hematuria, urgency, frequency, flank pain or fever Denies vaginal discharge, odor, itching, or rash LMP unknown, has IUD  She reports frequent UTIs Does not drink caffeine or sugar, drinks a lot of water Reports takes several supplements including turmeric and fish oil   In October had UA with blood and leuks, but culture negative   Past Medical History:  Diagnosis Date   Gestational hypertension     There are no active problems to display for this patient.   Past Surgical History:  Procedure Laterality Date   TONSILLECTOMY      OB History   No obstetric history on file.      Home Medications    Prior to Admission medications   Medication Sig Start Date End Date Taking? Authorizing Provider  phenazopyridine (PYRIDIUM) 100 MG tablet Take 1 tablet (100 mg total) by mouth 3 (three) times daily as needed for up to 2 days for pain. 08/12/23 08/14/23 Yes Marlea Gambill, Lurena Joiner, PA-C  albuterol (VENTOLIN HFA) 108 (90 Base) MCG/ACT inhaler Inhale 1-2 puffs into the lungs every 6 (six) hours as needed. 05/30/23   Garrison, [place] N, FNP  benzonatate (TESSALON) 100 MG capsule Take 2 capsules (200 mg total) by mouth every 8 (eight) hours. 05/30/23   Garrison, [place] N, FNP  ibuprofen (ADVIL) 800 MG tablet Take 1 tablet (800 mg total) by mouth 3 (three) times daily. 05/28/23   Mukund Weinreb, Lurena Joiner PA-C    Family History Family History  Problem Relation Age of Onset   Healthy Mother    Cancer Father    Hyperlipidemia Father    Hypertension Father    Diabetes Father     Social History Social History   Tobacco Use   Smoking status: Never   Smokeless tobacco: Never  Vaping Use   Vaping status: Never  Used  Substance Use Topics   Alcohol use: Not Currently   Drug use: Never     Allergies   Patient has no known allergies.   Review of Systems Review of Systems Per HPI  Physical Exam Triage Vital Signs ED Triage Vitals  Encounter Vitals Group     BP 08/12/23 1757 135/86     Systolic BP Percentile --      Diastolic BP Percentile --      Pulse Rate 08/12/23 1757 85     Resp 08/12/23 1757 18     Temp 08/12/23 1757 98.3 F (36.8 C)     Temp Source 08/12/23 1757 Oral     SpO2 08/12/23 1757 98 %     Weight --      Height --      Head Circumference --      Peak Flow --      Pain Score 08/12/23 1758 0     Pain Loc --      Pain Education --      Exclude from Growth Chart --    No data found.  Updated Vital Signs BP 135/86 (BP Location: Left Arm)   Pulse 85   Temp 98.3 F (36.8 C) (Oral)   Resp 18   SpO2 98%    Physical Exam Vitals and nursing note reviewed.  Constitutional:      General: She is not in acute distress. HENT:     Mouth/Throat:     Mouth: Mucous membranes are moist.     Pharynx: Oropharynx is clear.  Eyes:     Conjunctiva/sclera: Conjunctivae normal.     Pupils: Pupils are equal, round, and reactive to light.  Cardiovascular:     Rate and Rhythm: Normal rate and regular rhythm.     Heart sounds: Normal heart sounds.  Pulmonary:     Effort: Pulmonary effort is normal.     Breath sounds: Normal breath sounds.  Abdominal:     Palpations: Abdomen is soft.     Tenderness: There is no abdominal tenderness. There is no right CVA tenderness, left CVA tenderness or guarding.  Neurological:     Mental Status: She is alert and oriented to person, place, and time.      UC Treatments / Results  Labs (all labs ordered are listed, but only abnormal results are displayed) Labs Reviewed  POCT URINALYSIS DIP (MANUAL ENTRY) - Abnormal; Notable for the following components:      Result Value   Color, UA straw (*)    Ketones, POC UA trace (5) (*)     Spec Grav, UA >=1.030 (*)    Protein Ur, POC trace (*)    All other components within normal limits  RPR  HIV ANTIBODY (ROUTINE TESTING W REFLEX)  POCT URINE PREGNANCY  CERVICOVAGINAL ANCILLARY ONLY    EKG   Radiology No results found.  Procedures Procedures (including critical care time)  Medications Ordered in UC Medications - No data to display  Initial Impression / Assessment and Plan / UC Course  I have reviewed the triage vital signs and the nursing notes.  Pertinent labs & imaging results that were available during my care of the patient were reviewed by me and considered in my medical decision making (see chart for details).  UPT negative UA with elevated spec grav. No sign of infection. Discussed with patient. Advised increase water, monitor symptoms. Can try pyridium if helpful 2-3 times daily for 2 days. Cytology swab is pending along with HIV/RPR. She is not concerned about STD exposure but would still like to check. I have also provided urology information, she will call to make appointment. Patient is agreeable to plan and no questions at this time  Final Clinical Impressions(s) / UC Diagnoses   Final diagnoses:  Dysuria  Screen for STD (sexually transmitted disease)     Discharge Instructions      We will call you if anything on your swab or blood work returns positive. You can also see these results on MyChart. Please abstain from sexual intercourse until your results return.  Try the pyridium 2-3 times daily for no longer than 2 days. This can help with urinary pain. Avoid sugar and caffeine.  Keep drinking lots of water!  Call the urology clinic first thing in the morning to make an appointment      ED Prescriptions     Medication Sig Dispense Auth. Provider   phenazopyridine (PYRIDIUM) 100 MG tablet Take 1 tablet (100 mg total) by mouth 3 (three) times daily as needed for up to 2 days for pain. 6 tablet Cove Haydon, Lurena Joiner, PA-C      PDMP not  reviewed this encounter.   Marlow Baars, [place] 08/12/23 1900

## 2023-08-13 LAB — CERVICOVAGINAL ANCILLARY ONLY
Bacterial Vaginitis (gardnerella): NEGATIVE
Candida Glabrata: NEGATIVE
Candida Vaginitis: NEGATIVE
Chlamydia: NEGATIVE
Comment: NEGATIVE
Comment: NEGATIVE
Comment: NEGATIVE
Comment: NEGATIVE
Comment: NEGATIVE
Comment: NORMAL
Neisseria Gonorrhea: NEGATIVE
Trichomonas: NEGATIVE

## 2023-08-13 LAB — RPR: RPR Ser Ql: NONREACTIVE

## 2023-08-20 ENCOUNTER — Encounter (HOSPITAL_COMMUNITY): Payer: Self-pay | Admitting: *Deleted

## 2023-08-20 ENCOUNTER — Ambulatory Visit (HOSPITAL_COMMUNITY)
Admission: EM | Admit: 2023-08-20 | Discharge: 2023-08-20 | Disposition: A | Discharge status: Home / Self Care | Payer: 59 | Attending: Family Medicine | Admitting: Family Medicine

## 2023-08-20 DIAGNOSIS — M542 Cervicalgia: Secondary | ICD-10-CM | POA: Diagnosis not present

## 2023-08-20 MED ORDER — KETOROLAC TROMETHAMINE 30 MG/ML IJ SOLN
INTRAMUSCULAR | Status: AC
  Filled 2023-08-20: qty 1

## 2023-08-20 MED ORDER — KETOROLAC TROMETHAMINE 10 MG PO TABS: 10.0000 mg | ORAL_TABLET | Freq: Four times a day (QID) | ORAL | 0 refills | Status: DC | PRN

## 2023-08-20 MED ORDER — KETOROLAC TROMETHAMINE 30 MG/ML IJ SOLN
30.0000 mg | Freq: Once | INTRAMUSCULAR | Status: AC
  Administered 2023-08-20: 30 mg via INTRAMUSCULAR

## 2023-08-20 MED ORDER — TIZANIDINE HCL 4 MG PO TABS: 4.0000 mg | ORAL_TABLET | Freq: Three times a day (TID) | ORAL | 0 refills | Status: DC | PRN

## 2023-08-20 NOTE — ED Provider Notes (Signed)
[facility]    CSN: [id number] Arrival date & time: 08/20/23  1616      History   Chief Complaint Chief Complaint  Patient presents with   Neck Pain    HPI Pamela Castro is a 38 y.o. female.    Neck Pain Here for left-sided neck pain.  It is been bothering her since about January 31, but then this morning it worsened suddenly when she bent down to pick up her son shoes.  No trauma or fall recently.  Prior to today she had been taking some ibuprofen 800 mg and it has been helping, but it has not helped today.  Last dose was about 3-4 hours ago.  No fever or cough or nausea or vomiting.  No rash  NKDA  She has an IUD.    Past Medical History:  Diagnosis Date   Gestational hypertension     There are no active problems to display for this patient.   Past Surgical History:  Procedure Laterality Date   TONSILLECTOMY      OB History   No obstetric history on file.      Home Medications    Prior to Admission medications   Medication Sig Start Date End Date Taking? Authorizing Provider  ketorolac (TORADOL) 10 MG tablet Take 1 tablet (10 mg total) by mouth every 6 (six) hours as needed (pain). 08/20/23  Yes Zenia Resides, MD  tiZANidine (ZANAFLEX) 4 MG tablet Take 1 tablet (4 mg total) by mouth every 8 (eight) hours as needed for muscle spasms. 08/20/23  Yes Zenia Resides, MD  albuterol (VENTOLIN HFA) 108 (90 Base) MCG/ACT inhaler Inhale 1-2 puffs into the lungs every 6 (six) hours as needed. 05/30/23   Garrison, [place] N, FNP    Family History Family History  Problem Relation Age of Onset   Healthy Mother    Cancer Father    Hyperlipidemia Father    Hypertension Father    Diabetes Father     Social History Social History   Tobacco Use   Smoking status: Never   Smokeless tobacco: Never  Vaping Use   Vaping status: Never Used  Substance Use Topics   Alcohol use: Not Currently   Drug use: Never     Allergies   Patient  has no known allergies.   Review of Systems Review of Systems  Musculoskeletal:  Positive for neck pain.     Physical Exam Triage Vital Signs ED Triage Vitals  Encounter Vitals Group     BP 08/20/23 1702 (!) 168/118     Systolic BP Percentile --      Diastolic BP Percentile --      Pulse Rate 08/20/23 1702 (!) 102     Resp 08/20/23 1702 16     Temp 08/20/23 1702 98.7 F (37.1 C)     Temp Source 08/20/23 1702 Oral     SpO2 08/20/23 1702 100 %     Weight --      Height --      Head Circumference --      Peak Flow --      Pain Score 08/20/23 1701 10     Pain Loc --      Pain Education --      Exclude from Growth Chart --    No data found.  Updated Vital Signs BP (!) 168/118 (BP Location: Left Arm)   Pulse (!) 102   Temp 98.7 F (37.1 C) (Oral)  Resp 16   SpO2 100%   Visual Acuity Right Eye Distance:   Left Eye Distance:   Bilateral Distance:    Right Eye Near:   Left Eye Near:    Bilateral Near:     Physical Exam Vitals reviewed.  Constitutional:      General: She is not in acute distress.    Appearance: She is not ill-appearing, toxic-appearing or diaphoretic.  HENT:     Mouth/Throat:     Mouth: Mucous membranes are moist.  Eyes:     Extraocular Movements: Extraocular movements intact.     Conjunctiva/sclera: Conjunctivae normal.     Pupils: Pupils are equal, round, and reactive to light.  Neck:     Comments: There is tenderness and spasm of her left trapezius and the neck.  There is no erythema or induration or rash. Cardiovascular:     Rate and Rhythm: Normal rate and regular rhythm.     Heart sounds: No murmur heard. Pulmonary:     Effort: Pulmonary effort is normal.     Breath sounds: Normal breath sounds.  Lymphadenopathy:     Cervical: No cervical adenopathy.  Skin:    Coloration: Skin is not jaundiced or pale.  Neurological:     General: No focal deficit present.     Mental Status: She is alert and oriented to person, place, and time.   Psychiatric:        Behavior: Behavior normal.      UC Treatments / Results  Labs (all labs ordered are listed, but only abnormal results are displayed) Labs Reviewed - No data to display  EKG   Radiology No results found.  Procedures Procedures (including critical care time)  Medications Ordered in UC Medications  ketorolac (TORADOL) 30 MG/ML injection 30 mg (has no administration in time range)    Initial Impression / Assessment and Plan / UC Course  I have reviewed the triage vital signs and the nursing notes.  Pertinent labs & imaging results that were available during my care of the patient were reviewed by me and considered in my medical decision making (see chart for details).     Toradol injections given here and Toradol tablets are sent to the pharmacy.  Tizanidine is sent in as a muscle relaxer.  Neck exercises are given for her to start when she started to feel better.  She is given contact permission for orthopedics Final Clinical Impressions(s) / UC Diagnoses   Final diagnoses:  Neck pain     Discharge Instructions      You have been given a shot of Toradol 30 mg today.  Ketorolac 10 mg tablets--take 1 tablet every 6 hours as needed for pain.  This is the same medicine that is in the shot we just gave you   Take tizanidine 4 mg--1 every 8 hours as needed for muscle spasms; this medication can cause dizziness and sleepiness  Heating pad or warm compress can help relax her neck muscles.       ED Prescriptions     Medication Sig Dispense Auth. Provider   ketorolac (TORADOL) 10 MG tablet Take 1 tablet (10 mg total) by mouth every 6 (six) hours as needed (pain). 20 tablet Mena Lienau, Janace Aris, MD   tiZANidine (ZANAFLEX) 4 MG tablet Take 1 tablet (4 mg total) by mouth every 8 (eight) hours as needed for muscle spasms. 15 tablet Duaine Radin, Janace Aris, MD      PDMP not reviewed this encounter.   Lloyd Ayo,  Janace Aris, MD 08/20/23 [phone]

## 2023-08-20 NOTE — Discharge Instructions (Signed)
You have been given a shot of Toradol 30 mg today.  Ketorolac 10 mg tablets--take 1 tablet every 6 hours as needed for pain.  This is the same medicine that is in the shot we just gave you   Take tizanidine 4 mg--1 every 8 hours as needed for muscle spasms; this medication can cause dizziness and sleepiness  Heating pad or warm compress can help relax her neck muscles.

## 2023-08-20 NOTE — ED Triage Notes (Signed)
Pt states she is having left sided neck pain X 3 days. She has been taking 800mg  IBU She took dose 2 hours ago.

## 2023-12-18 ENCOUNTER — Encounter (HOSPITAL_COMMUNITY): Payer: Self-pay | Admitting: *Deleted

## 2023-12-18 ENCOUNTER — Ambulatory Visit (HOSPITAL_COMMUNITY)
Admission: EM | Admit: 2023-12-18 | Discharge: 2023-12-18 | Disposition: A | Discharge status: Home / Self Care | Attending: Family Medicine | Admitting: Family Medicine

## 2023-12-18 DIAGNOSIS — B029 Zoster without complications: Secondary | ICD-10-CM

## 2023-12-18 MED ORDER — VALACYCLOVIR HCL 1 G PO TABS: 1000.0000 mg | ORAL_TABLET | Freq: Three times a day (TID) | ORAL | 0 refills | Status: AC

## 2023-12-18 MED ORDER — IBUPROFEN 600 MG PO TABS
600.0000 mg | ORAL_TABLET | Freq: Three times a day (TID) | ORAL | 0 refills | Status: AC | PRN
Start: 2023-12-18 — End: ?

## 2023-12-18 NOTE — ED Provider Notes (Signed)
 [facility]    CSN: [id number] Arrival date & time: 12/18/23  1057      History   Chief Complaint Chief Complaint  Patient presents with   Rash    HPI Pamela Castro is a 38 y.o. female.    Rash Here for a rash on her right upper medial arm.  It has been there since May 31.  It is itching a little bit but is burning and tingling and hurting.   No fever NKDA  It has been a while since her last menstrual cycle, as she has an IUD  Past Medical History:  Diagnosis Date   Gestational hypertension     There are no active problems to display for this patient.   Past Surgical History:  Procedure Laterality Date   TONSILLECTOMY      OB History   No obstetric history on file.      Home Medications    Prior to Admission medications   Medication Sig Start Date End Date Taking? Authorizing Provider  ibuprofen  (ADVIL ) 600 MG tablet Take 1 tablet (600 mg total) by mouth every 8 (eight) hours as needed (pain). 12/18/23  Yes Ellijah Leffel K, MD  valACYclovir (VALTREX) 1000 MG tablet Take 1 tablet (1,000 mg total) by mouth 3 (three) times daily for 7 days. 12/18/23 12/25/23 Yes Ann Keto, MD  albuterol  (VENTOLIN  HFA) 108 (90 Base) MCG/ACT inhaler Inhale 1-2 puffs into the lungs every 6 (six) hours as needed. 05/30/23   Harlow Lighter, [place]  N, FNP    Family History Family History  Problem Relation Age of Onset   Healthy Mother    Cancer Father    Hyperlipidemia Father    Hypertension Father    Diabetes Father     Social History Social History   Tobacco Use   Smoking status: Never   Smokeless tobacco: Never  Vaping Use   Vaping status: Never Used  Substance Use Topics   Alcohol use: Not Currently   Drug use: Never     Allergies   Patient has no known allergies.   Review of Systems Review of Systems  Skin:  Positive for rash.     Physical Exam Triage Vital Signs ED Triage Vitals  Encounter Vitals Group     BP 12/18/23 1220  (!) 140/100     Systolic BP Percentile --      Diastolic BP Percentile --      Pulse Rate 12/18/23 1220 84     Resp 12/18/23 1220 16     Temp 12/18/23 1220 98.8 F (37.1 C)     Temp Source 12/18/23 1220 Oral     SpO2 12/18/23 1220 98 %     Weight --      Height --      Head Circumference --      Peak Flow --      Pain Score 12/18/23 1219 10     Pain Loc --      Pain Education --      Exclude from Growth Chart --    No data found.  Updated Vital Signs BP (!) 140/100 (BP Location: Left Arm)   Pulse 84   Temp 98.8 F (37.1 C) (Oral)   Resp 16   SpO2 98%   Visual Acuity Right Eye Distance:   Left Eye Distance:   Bilateral Distance:    Right Eye Near:   Left Eye Near:    Bilateral Near:     Physical Exam  Vitals reviewed.  Constitutional:      General: She is not in acute distress.    Appearance: She is not ill-appearing, toxic-appearing or diaphoretic.  Skin:    Coloration: Skin is not jaundiced or pale.     Comments: There is a cluster of unroofed vesicles on her right medial upper arm.  No sign of secondary infection and no drainage.  Neurological:     General: No focal deficit present.     Mental Status: She is alert and oriented to person, place, and time.  Psychiatric:        Behavior: Behavior normal.      UC Treatments / Results  Labs (all labs ordered are listed, but only abnormal results are displayed) Labs Reviewed - No data to display  EKG   Radiology No results found.  Procedures Procedures (including critical care time)  Medications Ordered in UC Medications - No data to display  Initial Impression / Assessment and Plan / UC Course  I have reviewed the triage vital signs and the nursing notes.  Pertinent labs & imaging results that were available during my care of the patient were reviewed by me and considered in my medical decision making (see chart for details).   I think she has shingles.  Valtrex is sent in along with some  ibuprofen  prescription strength for the pain.  She is given instructions on how to self schedule for primary care. Final Clinical Impressions(s) / UC Diagnoses   Final diagnoses:  Herpes zoster without complication     Discharge Instructions      Take valacyclovir 1000 mg--1 tablet 3 times daily for 7 days  Take ibuprofen  600 mg--1 tab every 8 hours as needed for pain.  You can use the QR code/website at the back of the summary paperwork to schedule yourself a new patient appointment with primary care     ED Prescriptions     Medication Sig Dispense Auth. Provider   valACYclovir (VALTREX) 1000 MG tablet Take 1 tablet (1,000 mg total) by mouth 3 (three) times daily for 7 days. 21 tablet Pat Sires K, MD   ibuprofen  (ADVIL ) 600 MG tablet Take 1 tablet (600 mg total) by mouth every 8 (eight) hours as needed (pain). 15 tablet Vonita Calloway K, MD      PDMP not reviewed this encounter.   Ann Keto, MD 12/18/23 [id number]

## 2023-12-18 NOTE — Discharge Instructions (Signed)
 Take valacyclovir 1000 mg--1 tablet 3 times daily for 7 days  Take ibuprofen  600 mg--1 tab every 8 hours as needed for pain.  You can use the QR code/website at the back of the summary paperwork to schedule yourself a new patient appointment with primary care

## 2023-12-18 NOTE — ED Notes (Signed)
 Called patient in lobby but did not come

## 2023-12-18 NOTE — ED Triage Notes (Signed)
 Pt states she a rash on her upper right arm x 3 days. She went to the beach last week and she can only think its related to a portable hammock she carried around. She has been using blue star ointment and antifungal cream

## 2024-04-02 ENCOUNTER — Encounter (HOSPITAL_COMMUNITY): Payer: Self-pay | Admitting: Emergency Medicine

## 2024-04-02 ENCOUNTER — Ambulatory Visit (HOSPITAL_COMMUNITY)
Admission: EM | Admit: 2024-04-02 | Discharge: 2024-04-02 | Disposition: A | Discharge status: Home / Self Care | Attending: Internal Medicine | Admitting: Internal Medicine

## 2024-04-02 ENCOUNTER — Other Ambulatory Visit: Payer: Self-pay

## 2024-04-02 DIAGNOSIS — B349 Viral infection, unspecified: Secondary | ICD-10-CM

## 2024-04-02 DIAGNOSIS — R051 Acute cough: Secondary | ICD-10-CM

## 2024-04-02 LAB — POC SARS CORONAVIRUS 2 AG -  ED: SARS Coronavirus 2 Ag: NEGATIVE

## 2024-04-02 MED ORDER — GUAIFENESIN ER 1200 MG PO TB12: 1200.0000 mg | ORAL_TABLET | Freq: Two times a day (BID) | ORAL | 0 refills | Status: AC

## 2024-04-02 MED ORDER — PROMETHAZINE-DM 6.25-15 MG/5ML PO SYRP: 5.0000 mL | ORAL_SOLUTION | Freq: Every evening | ORAL | 0 refills | Status: AC | PRN

## 2024-04-02 NOTE — Discharge Instructions (Addendum)

## 2024-04-02 NOTE — ED Provider Notes (Signed)
 [facility]    CSN: [id number] Arrival date & time: 04/02/24  0900      History   Chief Complaint Chief Complaint  Patient presents with   Cough    HPI Pamela Castro is a 38 y.o. female.   Pamela Castro is a 38 y.o. female presenting for chief complaint of Cough, congestion, and sore throat that started 3 days ago.  Reports generalized headache, sneezing, and rhinorrhea as well.  Cough is productive with clear sputum.  States her sense of taste and smell has been diminished since she became sick.  Denies recent sick contacts with similar symptoms. Never smoker, denies drug use and history of asthma/copd.  Taking OTC medications for symptoms without relief.    Cough   Past Medical History:  Diagnosis Date   Gestational hypertension     There are no active problems to display for this patient.   Past Surgical History:  Procedure Laterality Date   TONSILLECTOMY      OB History   No obstetric history on file.      Home Medications    Prior to Admission medications   Medication Sig Start Date End Date Taking? Authorizing Provider  Guaifenesin  1200 MG TB12 Take 1 tablet (1,200 mg total) by mouth in the morning and at bedtime. 04/02/24  Yes Enedelia Dorna HERO, FNP  promethazine -dextromethorphan (PROMETHAZINE -DM) 6.25-15 MG/5ML syrup Take 5 mLs by mouth at bedtime as needed for cough. 04/02/24  Yes Enedelia Dorna HERO, FNP  albuterol  (VENTOLIN  HFA) 108 (90 Base) MCG/ACT inhaler Inhale 1-2 puffs into the lungs every 6 (six) hours as needed. 05/30/23   [name], [place]  N, FNP  cetirizine  (ZYRTEC ) 10 MG tablet Take 1 tablet by mouth daily.    [provider]  ibuprofen  (ADVIL ) 600 MG tablet Take 1 tablet (600 mg total) by mouth every 8 (eight) hours as needed (pain). 12/18/23   Vonna Sharlet POUR, MD    Family History Family History  Problem Relation Age of Onset   Healthy Mother    Cancer Father    Hyperlipidemia Father     Hypertension Father    Diabetes Father     Social History Social History   Tobacco Use   Smoking status: Never   Smokeless tobacco: Never  Vaping Use   Vaping status: Never Used  Substance Use Topics   Alcohol use: Not Currently   Drug use: Never     Allergies   Patient has no known allergies.   Review of Systems Review of Systems  Respiratory:  Positive for cough.   Per HPI  Physical Exam Triage Vital Signs ED Triage Vitals  Encounter Vitals Group     BP 04/02/24 0956 (!) 129/98     Girls Systolic BP Percentile --      Girls Diastolic BP Percentile --      Boys Systolic BP Percentile --      Boys Diastolic BP Percentile --      Pulse Rate 04/02/24 0956 88     Resp 04/02/24 0956 20     Temp 04/02/24 0956 98.2 F (36.8 C)     Temp Source 04/02/24 0956 Oral     SpO2 04/02/24 0956 97 %     Weight --      Height --      Head Circumference --      Peak Flow --      Pain Score 04/02/24 0952 6     Pain Loc --  Pain Education --      Exclude from Growth Chart --    No data found.  Updated Vital Signs BP (!) 129/98 (BP Location: Left Arm)   Pulse 88   Temp 98.2 F (36.8 C) (Oral)   Resp 20   LMP 03/29/2024 (Approximate)   SpO2 97%   Visual Acuity Right Eye Distance:   Left Eye Distance:   Bilateral Distance:    Right Eye Near:   Left Eye Near:    Bilateral Near:     Physical Exam Vitals and nursing note reviewed.  Constitutional:      Appearance: She is not ill-appearing or toxic-appearing.  HENT:     Head: Normocephalic and atraumatic.     Right Ear: Hearing, tympanic membrane, ear canal and external ear normal.     Left Ear: Hearing, tympanic membrane, ear canal and external ear normal.     Nose: Congestion present.     Mouth/Throat:     Lips: Pink.     Mouth: Mucous membranes are moist. No injury or oral lesions.     Dentition: Normal dentition.     Tongue: No lesions.     Pharynx: Oropharynx is clear. Uvula midline. No pharyngeal  swelling, oropharyngeal exudate, posterior oropharyngeal erythema, uvula swelling or postnasal drip.     Tonsils: No tonsillar exudate.  Eyes:     General: Lids are normal. Vision grossly intact. Gaze aligned appropriately.     Extraocular Movements: Extraocular movements intact.     Conjunctiva/sclera: Conjunctivae normal.  Neck:     Trachea: Trachea and phonation normal.  Cardiovascular:     Rate and Rhythm: Normal rate and regular rhythm.     Heart sounds: Normal heart sounds, S1 normal and S2 normal.  Pulmonary:     Effort: Pulmonary effort is normal. No respiratory distress.     Breath sounds: Normal breath sounds and air entry.  Musculoskeletal:     Cervical back: Neck supple.  Lymphadenopathy:     Cervical: No cervical adenopathy.  Skin:    General: Skin is warm and dry.     Capillary Refill: Capillary refill takes less than 2 seconds.     Findings: No rash.  Neurological:     General: No focal deficit present.     Mental Status: She is alert and oriented to person, place, and time. Mental status is at baseline.     Cranial Nerves: No dysarthria or facial asymmetry.  Psychiatric:        Mood and Affect: Mood normal.        Speech: Speech normal.        Behavior: Behavior normal.        Thought Content: Thought content normal.        Judgment: Judgment normal.      UC Treatments / Results  Labs (all labs ordered are listed, but only abnormal results are displayed) Labs Reviewed  POC SARS CORONAVIRUS 2 AG -  ED    EKG   Radiology No results found.  Procedures Procedures (including critical care time)  Medications Ordered in UC Medications - No data to display  Initial Impression / Assessment and Plan / UC Course  I have reviewed the triage vital signs and the nursing notes.  Pertinent labs & imaging results that were available during my care of the patient were reviewed by me and considered in my medical decision making (see chart for details).   1.  Viral syndrome, acute cough Suspect viral  URI, viral syndrome.  Strep/viral testing: POC COVID-19 negative.   Physical exam findings reassuring, vital signs hemodynamically stable, and lungs clear, therefore deferred imaging of the chest.  Advised supportive care/prescriptions for symptomatic relief as outlined in AVS.    Counseled patient on potential for adverse effects with medications prescribed/recommended today, strict ER and return-to-clinic precautions discussed, patient verbalized understanding.    Final Clinical Impressions(s) / UC Diagnoses   Final diagnoses:  Acute cough  Viral syndrome     Discharge Instructions      You have a viral illness which will improve on its own with rest, fluids, and medications to help with your symptoms.  Tylenol, guaifenesin  (plain mucinex ), and saline nasal sprays may help relieve symptoms.   Two teaspoons of honey in 1 cup of warm water every 4-6 hours may help with throat pains.  Humidifier in room at nighttime may help soothe cough (clean well daily).   Take Promethazine  DM cough medication to help with your cough at nighttime so that you are able to sleep. Do not drive, drink alcohol, or go to work while taking this medication since it can make you sleepy. Only take this at nighttime.   For chest pain, shortness of breath, inability to keep food or fluids down without vomiting, fever that does not respond to tylenol or motrin , or any other severe symptoms, please go to the ER for further evaluation. Return to urgent care as needed, otherwise follow-up with PCP.     ED Prescriptions     Medication Sig Dispense Auth. Provider   Guaifenesin  1200 MG TB12 Take 1 tablet (1,200 mg total) by mouth in the morning and at bedtime. 14 tablet Enedelia Dorna HERO, FNP   promethazine -dextromethorphan (PROMETHAZINE -DM) 6.25-15 MG/5ML syrup Take 5 mLs by mouth at bedtime as needed for cough. 118 mL Enedelia Dorna HERO, FNP      PDMP not  reviewed this encounter.   Enedelia Dorna HERO, [place] 04/02/24 1739

## 2024-04-02 NOTE — ED Triage Notes (Signed)
 Patient started feeling bad Sunday night,  takes a lot of herbs.  Patient has had a stuffy, runny nose, sneezing, headache, fatigue, aching eyes, and now has lost sense of taste and smell.    Has taken dayquil, nyquil since Sunday.

## 2024-05-10 LAB — HEMOGLOBIN A1C: Hemoglobin A1C: 5.5

## 2024-05-10 LAB — AMB RESULTS CONSOLE CBG: Glucose: 133

## 2024-05-27 ENCOUNTER — Encounter (HOSPITAL_COMMUNITY): Payer: Self-pay | Admitting: *Deleted

## 2024-05-27 ENCOUNTER — Ambulatory Visit (HOSPITAL_COMMUNITY): Admission: EM | Admit: 2024-05-27 | Discharge: 2024-05-27 | Disposition: A | Discharge status: Home / Self Care

## 2024-05-27 DIAGNOSIS — H00011 Hordeolum externum right upper eyelid: Secondary | ICD-10-CM | POA: Diagnosis not present

## 2024-05-27 NOTE — Discharge Instructions (Signed)
 Apply warm compresses periodically throughout the day to help with swelling and to help with healing of stye. Avoid wearing your contacts over the next few days as this could be introducing more bacteria to the area and making this worse. Keep your appointment with your eye doctor scheduled for next week for further evaluation and management of this if it continues.

## 2024-05-27 NOTE — ED Provider Notes (Signed)
 [facility]    CSN: [id number] Arrival date & time: 05/27/24  1014      History   Chief Complaint Chief Complaint  Patient presents with   Eye Pain    HPI Pamela Castro is a 38 y.o. female.   Patient presents with swelling to right eyelid and that began upon waking this morning.  Patient was concerned that she may have a stye but was unsure.  Patient does wear contacts.  Patient denies any drainage from this area.  Patient denies any visual disturbances or eye pain.  The history is provided by the patient and medical records.  Eye Pain    Past Medical History:  Diagnosis Date   Gestational hypertension     There are no active problems to display for this patient.   Past Surgical History:  Procedure Laterality Date   TONSILLECTOMY      OB History   No obstetric history on file.      Home Medications    Prior to Admission medications   Medication Sig Start Date End Date Taking? Authorizing Provider  albuterol  (VENTOLIN  HFA) 108 (90 Base) MCG/ACT inhaler Inhale 1-2 puffs into the lungs every 6 (six) hours as needed. 05/30/23   [name], [place]  N, FNP  cetirizine  (ZYRTEC ) 10 MG tablet Take 1 tablet by mouth daily.    [provider]  Guaifenesin  1200 MG TB12 Take 1 tablet (1,200 mg total) by mouth in the morning and at bedtime. 04/02/24   Enedelia Dorna HERO, FNP  ibuprofen  (ADVIL ) 600 MG tablet Take 1 tablet (600 mg total) by mouth every 8 (eight) hours as needed (pain). 12/18/23   Banister, Pamela K, MD  promethazine -dextromethorphan (PROMETHAZINE -DM) 6.25-15 MG/5ML syrup Take 5 mLs by mouth at bedtime as needed for cough. 04/02/24   Enedelia Dorna HERO, FNP    Family History Family History  Problem Relation Age of Onset   Healthy Mother    Cancer Father    Hyperlipidemia Father    Hypertension Father    Diabetes Father     Social History Social History   Tobacco Use   Smoking status: Never   Smokeless tobacco: Never   Vaping Use   Vaping status: Never Used  Substance Use Topics   Alcohol use: Not Currently   Drug use: Never     Allergies   Patient has no known allergies.   Review of Systems Review of Systems Per HPI  Physical Exam Triage Vital Signs ED Triage Vitals  Encounter Vitals Group     BP 05/27/24 1033 (!) 143/85     Girls Systolic BP Percentile --      Girls Diastolic BP Percentile --      Boys Systolic BP Percentile --      Boys Diastolic BP Percentile --      Pulse Rate 05/27/24 1033 75     Resp 05/27/24 1033 16     Temp 05/27/24 1033 98.5 F (36.9 C)     Temp Source 05/27/24 1033 Oral     SpO2 05/27/24 1033 97 %     Weight --      Height --      Head Circumference --      Peak Flow --      Pain Score 05/27/24 1032 1     Pain Loc --      Pain Education --      Exclude from Growth Chart --    No data found.  Updated Vital  Signs BP (!) 143/85 (BP Location: Right Arm)   Pulse 75   Temp 98.5 F (36.9 C) (Oral)   Resp 16   SpO2 97%   Visual Acuity Right Eye Distance:   Left Eye Distance:   Bilateral Distance:    Right Eye Near:   Left Eye Near:    Bilateral Near:     Physical Exam Vitals and nursing note reviewed.  Constitutional:      General: She is awake.     Appearance: Normal appearance. She is well-developed and well-groomed.  Eyes:     General:        Right eye: Hordeolum present.     Comments: Hordeolum present to right upper eyelid with mild swelling noted to right upper eyelid  Skin:    General: Skin is warm and dry.  Neurological:     Mental Status: She is alert.  Psychiatric:        Behavior: Behavior is cooperative.      UC Treatments / Results  Labs (all labs ordered are listed, but only abnormal results are displayed) Labs Reviewed - No data to display  EKG   Radiology No results found.  Procedures Procedures (including critical care time)  Medications Ordered in UC Medications - No data to display  Initial  Impression / Assessment and Plan / UC Course  I have reviewed the triage vital signs and the nursing notes.  Pertinent labs & imaging results that were available during my care of the patient were reviewed by me and considered in my medical decision making (see chart for details).     Patient is overall well-appearing.  Vitals are stable.  Exam findings consistent with hordeolum.  Recommended warm compresses and avoiding contact use until healed.  Recommended keeping appointment with eye doctor for next week.  Discussed follow-up and return precautions. Final Clinical Impressions(s) / UC Diagnoses   Final diagnoses:  Hordeolum externum of right upper eyelid     Discharge Instructions      Apply warm compresses periodically throughout the day to help with swelling and to help with healing of stye. Avoid wearing your contacts over the next few days as this could be introducing more bacteria to the area and making this worse. Keep your appointment with your eye doctor scheduled for next week for further evaluation and management of this if it continues.   ED Prescriptions   None    PDMP not reviewed this encounter.   Johnie Flaming A, NP 05/27/24 1058

## 2024-05-27 NOTE — ED Triage Notes (Signed)
 Pt states that she woke up this morning and her right eye had a stye on it.
